# Patient Record
Sex: Female | Born: 1950
Health system: Southern US, Community
[De-identification: ages and names within clinical notes are randomized; demographics above are authoritative.]

## PROBLEM LIST (undated history)

## (undated) DIAGNOSIS — D759 Disease of blood and blood-forming organs, unspecified: Secondary | ICD-10-CM

## (undated) DIAGNOSIS — I10 Essential (primary) hypertension: Secondary | ICD-10-CM

## (undated) DIAGNOSIS — N189 Chronic kidney disease, unspecified: Secondary | ICD-10-CM

## (undated) DIAGNOSIS — Z8719 Personal history of other diseases of the digestive system: Secondary | ICD-10-CM

## (undated) DIAGNOSIS — E785 Hyperlipidemia, unspecified: Secondary | ICD-10-CM

## (undated) HISTORY — PX: KIDNEY TRANSPLANT: SHX239

## (undated) HISTORY — PX: RECTOCELE REPAIR: SHX761

## (undated) HISTORY — PX: OTHER SURGICAL HISTORY: SHX169

## (undated) HISTORY — PX: CYSTOCELE REPAIR: SHX163

## (undated) HISTORY — PX: BREAST EXCISIONAL BIOPSY: SUR124

## (undated) HISTORY — PX: ABDOMINAL HYSTERECTOMY: SHX81

## (undated) HISTORY — PX: SHOULDER SURGERY: SHX246

---

## 1998-11-13 ENCOUNTER — Ambulatory Visit (HOSPITAL_COMMUNITY): Admission: RE | Admit: 1998-11-13 | Discharge: 1998-11-13 | Payer: Self-pay | Admitting: Obstetrics & Gynecology

## 1998-11-13 ENCOUNTER — Encounter: Payer: Self-pay | Admitting: Obstetrics & Gynecology

## 1999-02-08 ENCOUNTER — Ambulatory Visit (HOSPITAL_COMMUNITY): Admission: RE | Admit: 1999-02-08 | Discharge: 1999-02-08 | Payer: Self-pay | Admitting: *Deleted

## 2000-09-12 ENCOUNTER — Ambulatory Visit (HOSPITAL_COMMUNITY): Admission: RE | Admit: 2000-09-12 | Discharge: 2000-09-12 | Payer: Self-pay | Admitting: Nephrology

## 2000-10-23 ENCOUNTER — Ambulatory Visit (HOSPITAL_COMMUNITY): Admission: RE | Admit: 2000-10-23 | Discharge: 2000-10-23 | Payer: Self-pay | Admitting: Obstetrics & Gynecology

## 2000-10-23 ENCOUNTER — Encounter: Payer: Self-pay | Admitting: Obstetrics & Gynecology

## 2000-11-23 ENCOUNTER — Encounter (INDEPENDENT_AMBULATORY_CARE_PROVIDER_SITE_OTHER): Payer: Self-pay | Admitting: *Deleted

## 2000-11-23 ENCOUNTER — Other Ambulatory Visit: Admission: RE | Admit: 2000-11-23 | Discharge: 2000-11-23 | Payer: Self-pay | Admitting: Obstetrics & Gynecology

## 2000-12-05 ENCOUNTER — Encounter: Payer: Self-pay | Admitting: Obstetrics & Gynecology

## 2000-12-05 ENCOUNTER — Ambulatory Visit (HOSPITAL_COMMUNITY): Admission: RE | Admit: 2000-12-05 | Discharge: 2000-12-05 | Payer: Self-pay | Admitting: Obstetrics & Gynecology

## 2000-12-20 ENCOUNTER — Encounter: Payer: Self-pay | Admitting: Obstetrics & Gynecology

## 2000-12-20 ENCOUNTER — Ambulatory Visit (HOSPITAL_COMMUNITY): Admission: RE | Admit: 2000-12-20 | Discharge: 2000-12-20 | Payer: Self-pay | Admitting: Obstetrics & Gynecology

## 2000-12-21 ENCOUNTER — Encounter (INDEPENDENT_AMBULATORY_CARE_PROVIDER_SITE_OTHER): Payer: Self-pay

## 2000-12-21 ENCOUNTER — Inpatient Hospital Stay (HOSPITAL_COMMUNITY): Admission: RE | Admit: 2000-12-21 | Discharge: 2000-12-24 | Payer: Self-pay | Admitting: Obstetrics & Gynecology

## 2001-11-14 ENCOUNTER — Encounter: Payer: Self-pay | Admitting: Obstetrics & Gynecology

## 2001-11-14 ENCOUNTER — Ambulatory Visit (HOSPITAL_COMMUNITY): Admission: RE | Admit: 2001-11-14 | Discharge: 2001-11-14 | Payer: Self-pay | Admitting: Obstetrics & Gynecology

## 2002-11-12 ENCOUNTER — Emergency Department (HOSPITAL_COMMUNITY): Admission: EM | Admit: 2002-11-12 | Discharge: 2002-11-12 | Payer: Self-pay | Admitting: Emergency Medicine

## 2002-11-12 ENCOUNTER — Encounter: Payer: Self-pay | Admitting: Emergency Medicine

## 2002-11-21 ENCOUNTER — Ambulatory Visit (HOSPITAL_COMMUNITY): Admission: RE | Admit: 2002-11-21 | Discharge: 2002-11-21 | Payer: Self-pay | Admitting: Gastroenterology

## 2002-11-21 ENCOUNTER — Encounter: Payer: Self-pay | Admitting: Gastroenterology

## 2002-12-24 ENCOUNTER — Ambulatory Visit (HOSPITAL_COMMUNITY): Admission: RE | Admit: 2002-12-24 | Discharge: 2002-12-24 | Payer: Self-pay | Admitting: Obstetrics & Gynecology

## 2002-12-24 ENCOUNTER — Encounter: Payer: Self-pay | Admitting: Obstetrics & Gynecology

## 2004-01-19 ENCOUNTER — Ambulatory Visit (HOSPITAL_COMMUNITY): Admission: RE | Admit: 2004-01-19 | Discharge: 2004-01-19 | Payer: Self-pay | Admitting: Obstetrics & Gynecology

## 2005-01-31 ENCOUNTER — Emergency Department (HOSPITAL_COMMUNITY): Admission: EM | Admit: 2005-01-31 | Discharge: 2005-01-31 | Payer: Self-pay | Admitting: Emergency Medicine

## 2005-05-17 ENCOUNTER — Ambulatory Visit (HOSPITAL_COMMUNITY): Admission: RE | Admit: 2005-05-17 | Discharge: 2005-05-17 | Payer: Self-pay | Admitting: Nephrology

## 2006-01-17 ENCOUNTER — Ambulatory Visit (HOSPITAL_COMMUNITY): Admission: RE | Admit: 2006-01-17 | Discharge: 2006-01-17 | Payer: Self-pay | Admitting: Obstetrics & Gynecology

## 2006-02-09 ENCOUNTER — Encounter: Admission: RE | Admit: 2006-02-09 | Discharge: 2006-02-09 | Payer: Self-pay | Admitting: Obstetrics & Gynecology

## 2006-12-12 ENCOUNTER — Inpatient Hospital Stay (HOSPITAL_COMMUNITY): Admission: RE | Admit: 2006-12-12 | Discharge: 2006-12-13 | Payer: Self-pay | Admitting: Obstetrics and Gynecology

## 2007-06-13 ENCOUNTER — Ambulatory Visit: Payer: Self-pay | Admitting: Vascular Surgery

## 2007-06-25 ENCOUNTER — Ambulatory Visit: Payer: Self-pay | Admitting: Vascular Surgery

## 2007-06-25 ENCOUNTER — Ambulatory Visit (HOSPITAL_COMMUNITY): Admission: RE | Admit: 2007-06-25 | Discharge: 2007-06-25 | Payer: Self-pay | Admitting: Vascular Surgery

## 2007-07-24 ENCOUNTER — Ambulatory Visit: Payer: Self-pay | Admitting: Vascular Surgery

## 2007-12-28 ENCOUNTER — Inpatient Hospital Stay (HOSPITAL_COMMUNITY): Admission: EM | Admit: 2007-12-28 | Discharge: 2007-12-29 | Payer: Self-pay | Admitting: Internal Medicine

## 2008-04-28 ENCOUNTER — Encounter: Admission: RE | Admit: 2008-04-28 | Discharge: 2008-04-28 | Payer: Self-pay | Admitting: Obstetrics & Gynecology

## 2008-05-08 ENCOUNTER — Emergency Department: Payer: Self-pay | Admitting: Emergency Medicine

## 2008-09-11 ENCOUNTER — Emergency Department (HOSPITAL_COMMUNITY): Admission: EM | Admit: 2008-09-11 | Discharge: 2008-09-11 | Payer: Self-pay | Admitting: Emergency Medicine

## 2008-09-14 ENCOUNTER — Emergency Department (HOSPITAL_COMMUNITY): Admission: EM | Admit: 2008-09-14 | Discharge: 2008-09-14 | Payer: Self-pay | Admitting: Emergency Medicine

## 2008-09-21 ENCOUNTER — Ambulatory Visit (HOSPITAL_COMMUNITY): Admission: RE | Admit: 2008-09-21 | Discharge: 2008-09-21 | Payer: Self-pay | Admitting: Internal Medicine

## 2010-09-26 ENCOUNTER — Encounter: Payer: Self-pay | Admitting: Internal Medicine

## 2010-12-20 LAB — BASIC METABOLIC PANEL
CO2: 25 mEq/L (ref 19–32)
Calcium: 8.9 mg/dL (ref 8.4–10.5)
Glucose, Bld: 89 mg/dL (ref 70–99)
Potassium: 3.7 mEq/L (ref 3.5–5.1)
Sodium: 139 mEq/L (ref 135–145)

## 2010-12-20 LAB — DIFFERENTIAL
Basophils Absolute: 0.1 10*3/uL (ref 0.0–0.1)
Basophils Relative: 1 % (ref 0–1)
Eosinophils Relative: 2 % (ref 0–5)
Lymphs Abs: 2.2 10*3/uL (ref 0.7–4.0)
Neutro Abs: 2.9 10*3/uL (ref 1.7–7.7)
Neutrophils Relative %: 50 % (ref 43–77)

## 2010-12-20 LAB — CBC
HCT: 36.1 % (ref 36.0–46.0)
Platelets: 193 10*3/uL (ref 150–400)
RBC: 4.19 MIL/uL (ref 3.87–5.11)
WBC: 5.7 10*3/uL (ref 4.0–10.5)

## 2010-12-20 LAB — SEDIMENTATION RATE: Sed Rate: 12 mm/hr (ref 0–22)

## 2011-01-18 NOTE — Op Note (Signed)
NAME:  Megan Small, Megan Small                 ACCOUNT NO.:  [id number]   MEDICAL RECORD NO.:  [id number]          PATIENT TYPE:  AMB   LOCATION:  SDS                          FACILITY:  [facility]   PHYSICIAN:  Di Kindle. Edilia Bo, M.D.DATE OF BIRTH:  07/21/1951   DATE OF PROCEDURE:  06/25/2007  DATE OF DISCHARGE:                               OPERATIVE REPORT   PREOPERATIVE DIAGNOSIS:  Chronic renal failure.   POSTOPERATIVE DIAGNOSIS:  Chronic renal failure.   PROCEDURE:  Placement of a new left upper arm AV fistula.   SURGEON:  Dr. Edilia Bo.   ASSISTANT:  Emilio Aspen, RNFA.   ANESTHESIA:  Local with sedation.   INDICATIONS:  This is a 60 year old woman with polycystic kidney  disease.  She was referred by Dr. Eliott Nine for an AV fistula.  She had  undergone a vein map which showed that the cephalic vein in the left  ranged from 0.3-0.4 cm in maximum diameter.  This was based on a vein  map on June 13, 2007.  She was sent up for Dr. Darrick Penna to place a  fistula on, the thought being if she could not have a fistula she would  not have a graft placed at this time.   TECHNIQUE:  The patient was taken to the operating room and sedated by  anesthesia.  The left upper extremity was prepped and draped in the  usual sterile fashion.  After the skin was anesthetized with 1%  lidocaine, a transverse incision was made just above the antecubital  level.  Here the cephalic vein was quite small. The artery was dissected  free beneath the fascia and this was quite small also.  Upon  interrogation with the Doppler, it became apparent that the patient had  a high bifurcation of the brachial artery and the ulnar artery was  selected for outflow as the patient had requested a fistula or no graft  at this time.  The cephalic vein was ligated distally and irrigated up  with heparinized saline. It was about a 3-1/2-mm vein.  The patient was  heparinized.  The ulnar artery was clamped proximally and distally  and a  longitudinal arteriotomy was made.  The vein was mobilized over and sewn  end-to-side to the artery using continuous 6-0 Prolene suture. At the  completion there was a thrill in the fistula.  Hemostasis was obtained  in the wound, the wound was closed with a deep layer of 3-0 Vicryl, the  skin closed with 4-0 Vicryl. A sterile dressing was applied.  The  patient tolerated the procedure well and was transferred to the recovery  room in satisfactory condition.  All needle and sponge counts were  correct.      Di Kindle. Edilia Bo, M.D.  Electronically Signed     CSD/MEDQ  D:  06/25/2007  T:  06/26/2007  Job:  [id number]

## 2011-01-18 NOTE — Procedures (Signed)
CEPHALIC VEIN MAPPING   INDICATION:  Left upper arm AVF occlusion.   HISTORY: ESRD.   EXAM:  Right cephalic vein mapping.   The right cephalic vein is compressible.  The diameter measurements  range from 0.19 cm to 0.39 cm.  The left cephalic vein not evaluation.   See attached work sheet for all measurements.   IMPRESSION:  Patent right cephalic vein, which is of acceptable diameter  for use as a dialysis access site in the upper arm.  Inadequate in  caliber in right forearm.   ___________________________________________  Di Kindle. Edilia Bo, M.D.   PB/MEDQ  D:  07/24/2007  T:  07/25/2007  Job:  [id number]

## 2011-01-18 NOTE — Assessment & Plan Note (Signed)
OFFICE VISIT   Megan Small, Megan Small  DOB:  [birth year]                                       07/24/2007  ZOXWR#:[id number]   I saw [name] in the office today for continued followup of her  dialysis access issues.  This is a 60 year old woman with polycystic  kidney disease.  She was set up to have a fistula placed in the left arm  and if this was not possible we were not to place an AV graft.  She  underwent surgery on 06/25/2007.  At the time of surgery it was noted  that her vein was quite small and she also had a high bifurcation of her  brachial artery.  However, since we were not going to place a graft, we  elected to try a fistula and unfortunately this did not work and is now  occluded.  She was sent over to be evaluated for a possible right arm  fistula.  Again, it is felt that we would not place a graft if a fistula  were not possible.   On examination she has a palpable brachial and radial pulse bilaterally.  Her fistula on the left upper arm is occluded.  There was a small stitch  in the lateral aspect of the wound which I removed.   Her vein map today shows that the upper arm cephalic vein on the right  is essentially the same size as it was on the left side.  The forearm  cephalic vein is very small.   I explained that I think we probably have a 50/50 chance that the  fistula in the right arm will be more successful than the one on the  left.  Again, I think the vein is quite small and did not distend out  very well.  I have offered to place this; however, she would prefer to  wait for now  until her kidney function suggests that dialysis is more imminent.  I  think this is reasonable given the small size of her vein.  I will be  happy to see her back at any time if she changes her mind.   Di Kindle. Edilia Bo, M.D.  Electronically Signed   CSD/MEDQ  D:  07/24/2007  T:  07/25/2007  Job:  [id number]   cc:   [name], M.D.

## 2011-01-18 NOTE — H&P (Signed)
[name], [name]                 ACCOUNT NO.:  [id number]   MEDICAL RECORD NO.:  [id number]          PATIENT TYPE:   LOCATION:                                 FACILITY:   PHYSICIAN:  Deirdre Peer. Polite, M.D. DATE OF BIRTH:  September 19, 1950   DATE OF ADMISSION:  DATE OF DISCHARGE:                              HISTORY & PHYSICAL   REASON FOR ADMISSION:  Dyspnea, rule out angina.   HISTORY OF PRESENT ILLNESS:  [name] presents to the office, second  visit, with a complaint of a 2-3 week history of chest pain, shortness  of breath, and fatigue.  She denies exertional shortness of breath, mid  sternal, occasionally with radiation to both arms and to her neck.  The  worst it has been is a 7/10.  She gets relief with rest.  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  She  is very short of breath and tired.  She does have a history of  hypertension, high cholesterol, renal insufficiency.  She states that  her last creatinine is 1.8.  She denies any orthopnea.  No PND.  She  does notice that she has had some swelling in her legs and weight gain.  From looking in the chart, it appears that the patient is approximately  16 pounds from previous visit less than one month ago.  She does have a  family history of coronary artery disease.  Her father had an MI at 84.   PAST MEDICAL HISTORY:  As stated above.  Significant for hypertension,  high cholesterol, renal insufficiency, per patient, secondary to  polycystic kidney disease, currently followed by Dr. Eliott Nine.   CURRENT MEDICATIONS:  1. Lisinopril 10 mg daily.  2. Altoprev 40 mg daily.  3. Premarin 0.625 mg daily.   SOCIAL HISTORY:  Negative for tobacco, alcohol, or drugs.   PAST SURGICAL HISTORY:  Significant for a tubal ligation in 1975.  Hysterectomy in 2001.  Patient states that she had attempt at an AV  fistula in the past in her left upper extremity.   ALLERGIES:  None noted.   FAMILY HISTORY:   Father with coronary artery disease, status post MI at  age 21.  Brother and mother with pancreatic cancer.   REVIEW OF SYSTEMS:  As stated in the HPI.   PHYSICAL EXAMINATION:  BP 140/88, temperature 97.9, pulse 70.  Weight  210.  HEENT:  Negative for JVD.  Negative for carotid bruits.  Otherwise  within normal limits.  CHEST:  Clear without rales or rhonchi.  CARDIOVASCULAR:  Regular S1 and S2.  No S3 appreciated.  ABDOMEN:  Obese.  No hepatosplenomegaly.  EXTREMITIES:  Edema 1+.  NEURO:  Nonfocal.   EKG:  Poor R wave progression.  Inverted T wave, V1-4.   ASSESSMENT:  1. Dyspnea, rule out angina.  2. Hypertension.  3. Dyslipidemia.  4. History of progressive renal dysfunction.  5. History of mild anemia.   RECOMMENDATIONS:  Patient will be admitted to a medicine floor bed.  Patient will have serial cardiac  enzymes.  Will have screening labs.  [facility] has been notified.  Will have further recommendations  after review of the above studies.      Deirdre Peer. Polite, M.D.  Electronically Signed     RDP/MEDQ  D:  12/28/2007  T:  12/28/2007  Job:  [id number]

## 2011-01-18 NOTE — Procedures (Signed)
CEPHALIC VEIN MAPPING   INDICATION:  End-stage renal disease.   HISTORY:  End-stage renal disease.   EXAM:   The right cephalic vein is not evaluated.   The left cephalic vein is compressible.   Diameter measurements range from 0.23 to 0.30 cm.   See attached worksheet for all measurements.   IMPRESSION:  Patent left cephalic vein which is of acceptable diameter  for use as a dialysis access site.   ___________________________________________  [name], MD   MG/MEDQ  D:  06/13/2007  T:  06/14/2007  Job:  [id number]

## 2011-01-18 NOTE — Assessment & Plan Note (Signed)
OFFICE VISIT   Small, Megan L  DOB:  11-12-1950                                       06/13/2007  ZOXWR#:[id number]   HISTORY:  The patient is a 59 year old female with polycystic kidney  disease.  She is referred by Dr. Sharen Heck for evaluation for  hemodialysis access.  She also has a past medical history significant  for hypertension.  She is right-handed.  She states that recently her  creatinine was over 2 but this has recovered somewhat with adjustments  in her medications.   PAST MEDICAL HISTORY:  She denies history of diabetes or coronary artery  disease.  She does have history of elevated cholesterol.  Past history  is also remarkable for polycystic liver and a pancreatic cyst.   PAST SURGICAL HISTORY:  Remarkable for pelvic prolapse repair.  She has  had no prior access procedures.   FAMILY HISTORY:  Remarkable for polycystic kidney disease in multiple  relatives.   SOCIAL HISTORY:  She is single, has 2 children.  Works as a Adult nurse at [employer]  She is a nonsmoker, nondrinker.   REVIEW OF SYSTEMS:  She is 5 feet 5 inches, 200 pounds.  She has some  dyspnea with exertion and kidney disease as listed above.  Her  pulmonary, GI, vascular, neuro, orthopedic and hematologic are all  negative.   MEDICATIONS:  1. Premarin 0.625 mg once daily.  2. Lisinopril 4 times daily.   DICTATION ENDED AT THIS POINT.   [name], MD  Electronically Signed   CEF/MEDQ  D:  06/13/2007  T:  06/14/2007  Job:  [id number]   cc:   [name], M.D.

## 2011-01-21 NOTE — Op Note (Signed)
NAMEELISHA, MCGRUDER                 ACCOUNT NO.:  [id number]   MEDICAL RECORD NO.:  [id number]          PATIENT TYPE:  AMB   LOCATION:  SDC                           FACILITY:  [facility]   PHYSICIAN:  Randye Lobo, M.D.   DATE OF BIRTH:  04-12-1951   DATE OF PROCEDURE:  12/12/2006  DATE OF DISCHARGE:                               OPERATIVE REPORT   PREOPERATIVE DIAGNOSES:  1. Genuine stress incontinence.  2. Incomplete vaginal prolapse.   POSTOPERATIVE DIAGNOSES:  1. Genuine stress incontinence.  2. Incomplete vaginal prolapse.   PROCEDURE:  A tension-free vaginal tape suburethral sling, cystoscopy,  anterior posterior colporrhaphy.   SURGEON:  Randye Lobo, M.D.   ASSISTANT:  Gerrit Friends. Aldona Bar, M.D.   ANESTHESIA:  Spinal epidural, local with 0.5% lidocaine with epinephrine  1:200,000.   IV FLUIDS:  1000 mL Ringer's lactate.   ESTIMATED BLOOD LOSS:  75 mL.   URINE OUTPUT:  400 mL.   COMPLICATIONS:  None.   INDICATIONS FOR PROCEDURE:  The patient was a 60 year old para 2  Caucasian female, status post total abdominal hysterectomy with  bilateral salpingo-oophorectomy in 2002 who presented to her primary  gynecologist, Dr. Annamaria Helling reporting vaginal protrusion.  The patient  was diagnosed with a second-degree rectocele and a small cystocele.  The  patient also upon questioning reported urinary incontinence with  coughing and sneezing.  The patient presented for further evaluation and  urodynamic testing on 11/06/2006 documented genuine stress incontinence  when the patient reached full bladder capacity.  The patient does have a  history of polycystic kidneys and she has a baseline creatinine of  approximately 1.5.  The patient now desires treatment of her cystocele  and rectocele along with her stress urinary incontinence and a plan was  made to proceed with a tension-free vaginal tape, suburethral sling,  cystoscopy, and the anterior and posterior colporrhaphy after  risks,  benefits, and alternatives are reviewed.   FINDINGS:  Examination under anesthesia revealed a first-degree  cystocele and a second-degree high rectocele.  There was good vaginal  apical support.  The cervix and uterus were noted to be absent.   Cystoscopy documented a normal bladder throughout 360 degrees including  the bladder dome and trigone.  The ureters were noted to be patent  bilaterally after the injection of indigo carmine dye.  During placement  of the sling, there was no evidence of a foreign body in the bladder or  the urethra.   SPECIMENS:  None.   PROCEDURE:  The patient was reidentified in the preoperative hold area.  She did receive Ancef 1 gram IV for antibiotic prophylaxis.  The patient  received TED hose and PAS stockings for DVT prophylaxis.   The patient was brought to the operating room where a spinal epidural  anesthetic was administered.  The patient was then placed in the dorsal  lithotomy position and the lower abdomen and vagina were sterilely  prepped and draped.  A Foley catheter was placed inside the bladder and  left to gravity drainage throughout the procedure.   An examination  under anesthesia was performed and the findings are as  noted above.   A weighted speculum was placed inside the vagina and Allis clamps were  used to mark the length of the anterior vaginal wall in the midline.  The vaginal mucosa was then injected with half percent lidocaine with  1:200,000 of epinephrine.  The anterior vaginal mucosa was then incised  vertically with a scalpel in the midline.  The vaginal mucosa was  dissected off of the subvaginal tissue and endopelvic fascia  bilaterally.  The dissection was carried back to the level of the pubic  rami and inferiorly to the level of the vaginal apex.  Hemostasis during  the dissection was created with monopolar cautery.   The suburethral sling was performed next.  It was performed in a top-  down fashion.  1  cm suprapubic incisions were then created 3 cm to the  right and to the left of the midline of the pubic symphysis.  The  abdominal needle passer was then placed through the right suprapubic  incision and down and out through the endopelvic fascia on the  ipsilateral side.  The same procedure that was performed on the right-  hand side was then repeated on the left-hand side.  The Foley catheter  was removed at this time and indigo carmine dye was injected IV.  Cystoscopy was performed and the findings are as noted above.  The  bladder was then drained of cystoscopy fluid and a Foley catheter was  replaced.  The sling was attached to the abdominal needle passers and  was drawn up through the suprapubic incisions bilaterally.  Tension on  the sling was adjusted and the sling was separated from the plastic  sheaths.  With a Kelly clamp placed between the sling and the urethra,  the plastic sheaths were then removed.  Excess sling material was  trimmed suprapubically.   The anterior colporrhaphy was performed with vertical mattress sutures  of 2-0 Vicryl which reduced the cystocele.  Excess vaginal mucosa was  trimmed anteriorly on the vaginal wall and the incision was then closed  with a running locked suture of 2-0 Vicryl.   The posterior colporrhaphy was performed last.  The Allis clamps were  used to mark the perineal body at the posterior vaginal wall in the  midline.  The perineum and the posterior vaginal mucosa were then  injected with half percent lidocaine with 1:200,000 of epinephrine.  A  triangular wedge of epithelium was excised from the perineal body in the  posterior vaginal mucosa was then incised vertically in the midline with  the Metzenbaum scissors.  The perirectal fascia was dissected off of the  overlying vaginal mucosa bilaterally.  The dissection was carried to the level of the vaginal apex.  There was no documented enterocele which was  appreciated.  At the top of  the posterior colporrhaphy, a pursestring  suture of 2-0 Vicryl was placed for the first suture for reduction of  the rectocele.  Then a series of two vertical mattress sutures of 2-0  Vicryl were placed.  The vertical mattress sutures continued to the  perineal body at this time next using 0-0 Vicryl.  Excess vaginal mucosa  was then trimmed and the posterior vaginal wall was closed with a  running locked suture of 2-0 Vicryl down to the level of the hymen.  A  crown stitch of 0-0 Vicryl was next placed at the level of the perineum.  The remainder of  the perineal body was then closed with a running suture  of 2-0 Vicryl which was continued along the perineum in a subcuticular  fashion for closure as for an episiotomy repair.   The suprapubic incisions were closed at this time with Dermabond.  A  vaginal packing with Estrace cream was placed in the vagina.  Final  rectal exam confirmed the absence of sutures in the rectum.   This concluded the patient's procedure.  There were no complications.  All needle, instrument, sponge counts were correct.      Randye Lobo, M.D.  Electronically Signed     BES/MEDQ  D:  12/12/2006  T:  12/12/2006  Job:  [id number]

## 2011-01-21 NOTE — Op Note (Signed)
[facility]  Patient:    Megan Small, Megan Small                        MRN: [id number] Proc. Date: 12/21/00 Adm. Date:  [id number] Attending:  Mickle Mallory                           Operative Report  AGE:                          60  PREOPERATIVE DIAGNOSIS:       Leiomyomatous uterus, abnormal uterine bleeding,                               menorrhagia, likely endometrial polyps.  POSTOPERATIVE DIAGNOSIS:      Leiomyomatous uterus, abnormal uterine bleeding,                               menorrhagia, likely endometrial polyps,                               pathology pending.  OPERATION:                    Total abdominal hysterectomy and bilateral                               salpingo-oophorectomy.  SURGEON:                      Gerrit Friends. Aldona Bar, M.D.  ASSISTANTDebbe Bales A. Edward Jolly, M.D.  ANESTHESIA:                   General endotracheal.  DESCRIPTION OF PROCEDURE:     The patient was taken to the operating room where after the satisfactory induction of general endotracheal anesthesia, she was prepped and draped, and placed in the supine position with the Foley catheter inserted as part of the prep.  Once the patient was adequately draped, the procedure was begun.  A Pfannenstiel incision was made with minimal difficulty.  It was dissected down sharply to and through the fascia in a low transverse fashion with hemostasis created at each layer.  Subfascial space was created inferiorly and superiorly, and the muscles separated in the midline, the peritoneum identified, and entered appropriately with care taken to avoid the bowel superiorly and the bladder inferiorly.  Exploration of the abdomen at this time was carried out.  Upper abdomen was benign.  Both kidneys were felt.  The uterus was approximately 12 weeks size and very irregular.  Both ovaries appeared normal.  There had been a previous tubal sterilization  procedure carried out.  At this time, the self-retaining OConnor-OSullivan retractor was placed and with minimal difficulty bowel packed off and hysterectomy begun.  The cornus of the uterus were elevated with long Kelly clamps.  Round ligaments were then suture secured, cut, and the lower uterine segment exposed once the bladder was pushed off.  The infundibulopelvic pedicles were identified.  The ureters were identified, and then the infundibulopelvic  pedicles were clamped, cut, and doubly suture secured with 0 Vicryl suture.  At this time, the uterine artery pedicles were skeletonized, clamped, cut, and suture secured with 0 Vicryl suture.  Because of the bulk of the uterus, the bulk of the fundus was removed.  This provided better exposure for the lower segment and cervix dissection.  The cervical stump was elevated and using straight Heaney clamps, the parametrial tissues were cut, clamped, and suture secured with 0 Vicryl suture down to the vaginal angle.  Uterosacral pedicles were taken in a similar fashion.  A stab wound was made anteriorly and using the Satinsky scissors, the cervix was dissected out in its entirety.  Vaginal cuff was then secured beginning in the angles with a figure-of-eight 0 Vicryl suture and then figure-of-eight interrupted 0 Vicryl sutures to close the vaginal cuff. Profuse irrigation was then carried out.  Identification of the ureters was again carried out and both ureters were noted to be of normal course and caliber.  There was a small amount of bleeding noted from the right ureterosacral pedicle, and this was suture secured with 0 Vicryl and 2-0 Vicryl suture.  Again, profuse irrigation was carried out and reidentification of the ureters carried out.  Hemostasis was found to be adequate and at this time the procedure was terminated.  At this time, the packs were removed.  Appendix was not visualized.  It was extremely retrocecal.  After all counts  were noted to be correct and no foreign bodies noted to be remaining in the abdominal cavity, the abdominal peritoneum was closed with 2-0 Vicryl in a running fashion and muscles secured with same. ________ fascial hemostasis, the fascia was then reapproximated with 0 Vicryl from angles to midline bilaterally.  The subcutaneous tissue was then rendered hemostatic and staples were then used to close the skin.  A sterile pressure dressing was then applied and the patient at this time was transported to the recovery room in satisfactory condition having tolerated the procedure well.  Estimated blood loss 200 cc.  All counts correct x 2.  In summary, this patient underwent a total abdominal hysterectomy and bilateral salpingo-oophorectomy for a leiomyomatous uterus associated with menorrhagia and likely endometrial polyps.  She tolerated the procedure well and was taken to the recovery room in good condition. DD:  12/21/00 TD:  12/22/00 Job: [id number]

## 2011-01-21 NOTE — Discharge Summary (Signed)
[facility]  Patient:    Megan Small, BACCHI                        MRN: [id number] Adm. Date:  [id number] Disc. Date: [id number] Attending:  Mickle Mallory CC:         Duke Salvia Eliott Nine, M.D.   Discharge Summary  DISCHARGE DIAGNOSES:          1. Leiomyomatous uterus.                               2. Adenomyosis.  PROCEDURE:                    Total abdominal hysterectomy, bilateral salpingo-oophorectomy.  HISTORY:                      This 60 year old female was admitted for a total abdominal hysterectomy and bilateral salpingo-oophorectomy with a preoperative diagnosis of menorrhagia, known fibroids, possible endometrial polyps, and dysmenorrhea.  After a normal preoperative workup she underwent a TAH, BSO on April 18.  The procedure went well.  Postoperatively she did have some apnea while sleeping.  Her O2 saturations remained while awake normal, but dozing dropped off to initially the range of the 70s, although as she got further out from her surgery ended up in the 90-92 range.  She was watched in the intensive care area on the night after surgery.  On the morning following surgery she still had a respiratory rate of about 6 while resting.  She also had a run of bigemini.  Cardiology was called and their opinion was if the patient was asymptomatic, probably that there was nothing to demand further workup.  Indeed, the patient was totally asymptomatic.  On April 19 her hemoglobin was 9.7, white count 6800.  Comprehensive metabolic profile normal. Her output was good.  She was transferred to the floor.  Thereafter, she did well during her entire postoperative course.  Another hemoglobin was done on the morning of April 20 which was 9.6 with a white count of 6500.  On the morning of April 21 she was ambulating well, tolerating a regular diet well, having normal bowel and bladder function, was afebrile.  The wound was clean and dry.  Her wound had  staples removed and was Steri-Stripped on April 21. Accordingly, she was discharged to home.  DISCHARGE MEDICATIONS:        1. Tylox one to two q.4-6h. p.r.n.                               2. Motrin 600 mg q.6h.                               3. Ferrous sulfate 300 mg q.d.                               4. Vivelle dot 0.05 mg changed twice weekly.  DISCHARGE INSTRUCTIONS:       She was given all specific instructions at the time of discharge and understood all instructions well.  She will return to the office in approximately 3-4 weeks for followup or as needed.  CONDITION ON DISCHARGE:  Improved. DD:  12/24/00 TD:  12/25/00 Job: [id number]

## 2011-01-21 NOTE — H&P (Signed)
[facility]  Patient:    Megan Small, Megan Small                          MRN: [id number] Adm. Date:  12/21/00 Attending:  Gerrit Friends. Aldona Bar, M.D.                         History and Physical  OFFICE NUMBER:                [id number]  ADMISSION INFORMATION:        This patient is being admitted to [facility] on the morning of December 21, 2000, after surgery.  Please try to have this history and physical to [facility] to the Day Surgery preop area. The patient is coming in around 9 a.m.  HISTORY:                      This patient is a gravida 2, para 59, 60 year old female admitted for total abdominal hysterectomy and bilateral salpingo-oophorectomy with a preoperative diagnosis of excessive menorrhagia, a known fibroid uterus, and endometrial polyps.  This patient was seen by me most recently in mid to late March, and at that time had a large 1 cm polyp removed from her cervix and had a good endometrial biopsy done as well which revealed disorder proliferative endometrium with breakdown, and the polyp was benign.  The patient presented with flooding and a history of very irregular cycles and when her cycles occur, they last for as long as 7-10 days with significant symptomatology.  It is almost to the point where it keeps her from going to work, as she works on the night shift as a Transport planner at [employer].  After the biopsy/evaluation, the patient returned for follow-up.  An FSH level was very premenopausal.  Discussion was carried out with the patient as to what could be offered, and the patient desired proceeding with surgical correction of her menorrhagia, known fibroids, and likely endometrial polyps.  In fact, as part of her evaluation, an ultrasound was done at [facility] which revealed a uterus which was lobular and enlarged, measuring 11 x 8.5 x 6 cm with at least five large intramural myomas.  As mentioned, an [facility]  level was done and was essentially normal.  The patient does have known polycystic kidney disease and is followed by Dr. Eliott Nine - recent renal function studies have all been within normal limits - (January 2002).  She did have a mammogram in February 2002, which was normal, and all of her recent cervical cytology has been within normal limits as well.  In summary, because of persistent symptomatic menorrhagia, known fibroids, and likely endometrial polyps, this patient is admitted for total abdominal hysterectomy and bilateral salpingo-oophorectomy.  PAST MEDICAL HISTORY:         Illnesses - as above.  MEDICATIONS:                  Essentially none.  PAST SURGICAL HISTORY:        1. Breast cyst.                               2. Tubal sterilization procedure 26 years ago.  3. Wisdom teeth when the patient was in her 67s.                               4. Tonsillectomy and adenoidectomy at age 48.  ALLERGIES:                    None known.  SOCIAL HISTORY AND FAMILY HISTORY:               Noncontributory.  REVIEW OF SYSTEMS:            Essentially negative with the exception of above.  PHYSICAL EXAMINATION:  GENERAL:                      Well-developed female.  VITAL SIGNS:                  Weight 200 pounds, blood pressure 150/80, pulse 80 and regular, respirations 18 and regular, temperature 98.2.  HEENT:                        Negative.  NECK:                         Thyroid not enlarged.  CHEST:                        Clear.  CARDIOVASCULAR:               No gallop or murmur.  BREASTS:                      Negative.  ABDOMEN:                      Unremarkable.  PELVIC:                       External genitalia and BUS normal.  Introitus by digital, vault fairly well supported.  Cervix with no gross lesions.  Uterus is very irregular and approximately 10-12 weeks size.  Adnexal unremarkable. Rectovaginal confirmatory.  RECTAL:                        Stool negative for blood.  EXTREMITIES/NEUROLOGIC:       Physiologic.  IMPRESSION:                   Symptomatic menorrhagia associated with a leiomyomata uterus and endometrial polyps - patient desires surgical correction.  PLAN:                         The patient will undergo a total abdominal hysterectomy and bilateral salpingo-oophorectomy. DD:  12/20/00 TD:  12/20/00 Job: [id number]

## 2011-03-25 ENCOUNTER — Ambulatory Visit: Payer: Self-pay | Admitting: Vascular Surgery

## 2011-04-01 ENCOUNTER — Ambulatory Visit: Payer: Self-pay | Admitting: Vascular Surgery

## 2011-04-11 ENCOUNTER — Other Ambulatory Visit: Payer: Self-pay | Admitting: Obstetrics & Gynecology

## 2011-04-11 DIAGNOSIS — Z1231 Encounter for screening mammogram for malignant neoplasm of breast: Secondary | ICD-10-CM

## 2011-05-10 ENCOUNTER — Ambulatory Visit
Admission: RE | Admit: 2011-05-10 | Discharge: 2011-05-10 | Disposition: A | Discharge status: Home / Self Care | Payer: PRIVATE HEALTH INSURANCE | Source: Ambulatory Visit | Attending: Obstetrics & Gynecology | Admitting: Obstetrics & Gynecology

## 2011-05-10 DIAGNOSIS — Z1231 Encounter for screening mammogram for malignant neoplasm of breast: Secondary | ICD-10-CM

## 2011-05-31 LAB — CARDIAC PANEL(CRET KIN+CKTOT+MB+TROPI)
CK, MB: 1.4
CK, MB: 1.6
CK, MB: 2
Relative Index: 1.8
Total CK: 110
Total CK: 83
Total CK: 90
Troponin I: <0.01

## 2011-05-31 LAB — CBC
HCT: 33.9 — ABNORMAL LOW
Hemoglobin: 11.8 — ABNORMAL LOW
MCV: 85.3
WBC: 5.5

## 2011-05-31 LAB — COMPREHENSIVE METABOLIC PANEL
ALT: 13
Glucose, Bld: 106 — ABNORMAL HIGH
Potassium: 4.2
Total Protein: 6.7

## 2011-05-31 LAB — TSH: TSH: 2.042

## 2011-05-31 LAB — B-NATRIURETIC PEPTIDE (CONVERTED LAB): Pro B Natriuretic peptide (BNP): <30

## 2011-05-31 LAB — LIPID PANEL
HDL: 66
Triglycerides: 192 — ABNORMAL HIGH
VLDL: 38

## 2011-05-31 LAB — HEMOGLOBIN A1C: Mean Plasma Glucose: 119

## 2011-06-15 LAB — POCT I-STAT 4, (NA,K, GLUC, HGB,HCT)
Operator id: 181601
Potassium: 4.1
Sodium: 135

## 2011-11-21 ENCOUNTER — Ambulatory Visit: Payer: Self-pay

## 2011-12-09 ENCOUNTER — Ambulatory Visit: Payer: Self-pay | Admitting: General Practice

## 2011-12-09 LAB — CBC WITH DIFFERENTIAL/PLATELET
HGB: 11.2 g/dL — ABNORMAL LOW (ref 12.0–16.0)
Lymphocyte #: 2.7 10*3/uL (ref 1.0–3.6)
Lymphocyte %: 44.5 %
MCH: 29.3 pg (ref 26.0–34.0)
Monocyte %: 6.6 %
Neutrophil #: 2.8 10*3/uL (ref 1.4–6.5)
Platelet: 211 10*3/uL (ref 150–440)

## 2011-12-09 LAB — APTT: Activated PTT: 27.4 secs (ref 23.6–35.9)

## 2011-12-16 ENCOUNTER — Ambulatory Visit: Payer: Self-pay | Admitting: General Practice

## 2012-03-01 ENCOUNTER — Ambulatory Visit: Payer: Self-pay | Admitting: Vascular Surgery

## 2012-06-12 DIAGNOSIS — T8611 Kidney transplant rejection: Secondary | ICD-10-CM | POA: Insufficient documentation

## 2012-11-16 ENCOUNTER — Other Ambulatory Visit: Payer: Self-pay

## 2012-11-16 DIAGNOSIS — Z1231 Encounter for screening mammogram for malignant neoplasm of breast: Secondary | ICD-10-CM

## 2012-12-10 ENCOUNTER — Ambulatory Visit
Admission: RE | Admit: 2012-12-10 | Discharge: 2012-12-10 | Disposition: A | Discharge status: Home / Self Care | Payer: 59 | Source: Ambulatory Visit

## 2012-12-10 DIAGNOSIS — Z1231 Encounter for screening mammogram for malignant neoplasm of breast: Secondary | ICD-10-CM

## 2013-07-05 DIAGNOSIS — R519 Headache, unspecified: Secondary | ICD-10-CM | POA: Insufficient documentation

## 2013-09-19 DIAGNOSIS — M76899 Other specified enthesopathies of unspecified lower limb, excluding foot: Secondary | ICD-10-CM | POA: Diagnosis not present

## 2013-10-08 DIAGNOSIS — Z940 Kidney transplant status: Secondary | ICD-10-CM | POA: Diagnosis not present

## 2013-10-08 DIAGNOSIS — I10 Essential (primary) hypertension: Secondary | ICD-10-CM | POA: Diagnosis not present

## 2013-10-08 DIAGNOSIS — E785 Hyperlipidemia, unspecified: Secondary | ICD-10-CM | POA: Diagnosis not present

## 2013-10-08 DIAGNOSIS — B349 Viral infection, unspecified: Secondary | ICD-10-CM | POA: Diagnosis not present

## 2013-10-08 DIAGNOSIS — D899 Disorder involving the immune mechanism, unspecified: Secondary | ICD-10-CM | POA: Diagnosis not present

## 2013-10-10 ENCOUNTER — Ambulatory Visit: Payer: Self-pay | Admitting: General Practice

## 2013-10-17 DIAGNOSIS — Q612 Polycystic kidney, adult type: Secondary | ICD-10-CM | POA: Diagnosis not present

## 2013-10-17 DIAGNOSIS — B259 Cytomegaloviral disease, unspecified: Secondary | ICD-10-CM | POA: Diagnosis not present

## 2013-10-17 DIAGNOSIS — Z79899 Other long term (current) drug therapy: Secondary | ICD-10-CM | POA: Diagnosis not present

## 2013-10-17 DIAGNOSIS — D649 Anemia, unspecified: Secondary | ICD-10-CM | POA: Diagnosis not present

## 2013-10-17 DIAGNOSIS — E785 Hyperlipidemia, unspecified: Secondary | ICD-10-CM | POA: Diagnosis not present

## 2013-10-17 DIAGNOSIS — I10 Essential (primary) hypertension: Secondary | ICD-10-CM | POA: Diagnosis not present

## 2013-10-17 DIAGNOSIS — D72819 Decreased white blood cell count, unspecified: Secondary | ICD-10-CM | POA: Diagnosis not present

## 2013-10-17 DIAGNOSIS — Z940 Kidney transplant status: Secondary | ICD-10-CM | POA: Diagnosis not present

## 2013-10-17 DIAGNOSIS — Z5181 Encounter for therapeutic drug level monitoring: Secondary | ICD-10-CM | POA: Diagnosis not present

## 2013-10-17 DIAGNOSIS — N186 End stage renal disease: Secondary | ICD-10-CM | POA: Diagnosis not present

## 2013-10-17 DIAGNOSIS — D631 Anemia in chronic kidney disease: Secondary | ICD-10-CM | POA: Diagnosis not present

## 2013-10-17 DIAGNOSIS — D899 Disorder involving the immune mechanism, unspecified: Secondary | ICD-10-CM | POA: Diagnosis not present

## 2013-10-17 DIAGNOSIS — B349 Viral infection, unspecified: Secondary | ICD-10-CM | POA: Diagnosis not present

## 2013-10-30 DIAGNOSIS — B349 Viral infection, unspecified: Secondary | ICD-10-CM | POA: Diagnosis not present

## 2013-10-30 DIAGNOSIS — D899 Disorder involving the immune mechanism, unspecified: Secondary | ICD-10-CM | POA: Diagnosis not present

## 2013-10-30 DIAGNOSIS — N186 End stage renal disease: Secondary | ICD-10-CM | POA: Diagnosis not present

## 2013-10-30 DIAGNOSIS — I10 Essential (primary) hypertension: Secondary | ICD-10-CM | POA: Diagnosis not present

## 2013-10-30 DIAGNOSIS — I120 Hypertensive chronic kidney disease with stage 5 chronic kidney disease or end stage renal disease: Secondary | ICD-10-CM | POA: Diagnosis not present

## 2013-10-30 DIAGNOSIS — Z940 Kidney transplant status: Secondary | ICD-10-CM | POA: Diagnosis not present

## 2013-11-05 ENCOUNTER — Encounter: Payer: Self-pay | Admitting: General Practice

## 2013-11-21 ENCOUNTER — Other Ambulatory Visit: Payer: Self-pay

## 2013-11-21 DIAGNOSIS — Z1231 Encounter for screening mammogram for malignant neoplasm of breast: Secondary | ICD-10-CM

## 2013-11-28 DIAGNOSIS — B349 Viral infection, unspecified: Secondary | ICD-10-CM | POA: Diagnosis not present

## 2013-11-28 DIAGNOSIS — D899 Disorder involving the immune mechanism, unspecified: Secondary | ICD-10-CM | POA: Diagnosis not present

## 2013-11-28 DIAGNOSIS — Z48298 Encounter for aftercare following other organ transplant: Secondary | ICD-10-CM | POA: Diagnosis not present

## 2013-11-28 DIAGNOSIS — I10 Essential (primary) hypertension: Secondary | ICD-10-CM | POA: Diagnosis not present

## 2013-11-28 DIAGNOSIS — G2581 Restless legs syndrome: Secondary | ICD-10-CM | POA: Insufficient documentation

## 2013-11-28 DIAGNOSIS — Z79899 Other long term (current) drug therapy: Secondary | ICD-10-CM | POA: Diagnosis not present

## 2013-11-28 DIAGNOSIS — Z940 Kidney transplant status: Secondary | ICD-10-CM | POA: Diagnosis not present

## 2013-12-04 ENCOUNTER — Encounter: Payer: Self-pay | Admitting: General Practice

## 2013-12-12 DIAGNOSIS — Z48298 Encounter for aftercare following other organ transplant: Secondary | ICD-10-CM | POA: Diagnosis not present

## 2013-12-12 DIAGNOSIS — Z940 Kidney transplant status: Secondary | ICD-10-CM | POA: Diagnosis not present

## 2013-12-13 ENCOUNTER — Ambulatory Visit
Admission: RE | Admit: 2013-12-13 | Discharge: 2013-12-13 | Disposition: A | Discharge status: Home / Self Care | Payer: Medicare Other | Source: Ambulatory Visit

## 2013-12-13 DIAGNOSIS — Z1231 Encounter for screening mammogram for malignant neoplasm of breast: Secondary | ICD-10-CM

## 2013-12-26 DIAGNOSIS — Z79899 Other long term (current) drug therapy: Secondary | ICD-10-CM | POA: Diagnosis not present

## 2013-12-26 DIAGNOSIS — Z48298 Encounter for aftercare following other organ transplant: Secondary | ICD-10-CM | POA: Diagnosis not present

## 2013-12-26 DIAGNOSIS — Z940 Kidney transplant status: Secondary | ICD-10-CM | POA: Diagnosis not present

## 2013-12-31 DIAGNOSIS — Z01419 Encounter for gynecological examination (general) (routine) without abnormal findings: Secondary | ICD-10-CM | POA: Diagnosis not present

## 2014-01-03 ENCOUNTER — Encounter: Payer: Self-pay | Admitting: General Practice

## 2014-01-23 DIAGNOSIS — Q612 Polycystic kidney, adult type: Secondary | ICD-10-CM | POA: Diagnosis not present

## 2014-01-23 DIAGNOSIS — Z940 Kidney transplant status: Secondary | ICD-10-CM | POA: Diagnosis not present

## 2014-01-23 DIAGNOSIS — D801 Nonfamilial hypogammaglobulinemia: Secondary | ICD-10-CM | POA: Diagnosis not present

## 2014-01-23 DIAGNOSIS — D899 Disorder involving the immune mechanism, unspecified: Secondary | ICD-10-CM | POA: Diagnosis not present

## 2014-01-23 DIAGNOSIS — D72819 Decreased white blood cell count, unspecified: Secondary | ICD-10-CM | POA: Diagnosis not present

## 2014-01-23 DIAGNOSIS — T8610 Unspecified complication of kidney transplant: Secondary | ICD-10-CM | POA: Diagnosis not present

## 2014-01-23 DIAGNOSIS — B349 Viral infection, unspecified: Secondary | ICD-10-CM | POA: Diagnosis not present

## 2014-02-07 DIAGNOSIS — Z940 Kidney transplant status: Secondary | ICD-10-CM | POA: Diagnosis not present

## 2014-02-20 DIAGNOSIS — E785 Hyperlipidemia, unspecified: Secondary | ICD-10-CM | POA: Diagnosis not present

## 2014-02-20 DIAGNOSIS — D899 Disorder involving the immune mechanism, unspecified: Secondary | ICD-10-CM | POA: Diagnosis not present

## 2014-02-20 DIAGNOSIS — Z940 Kidney transplant status: Secondary | ICD-10-CM | POA: Diagnosis not present

## 2014-02-20 DIAGNOSIS — I10 Essential (primary) hypertension: Secondary | ICD-10-CM | POA: Diagnosis not present

## 2014-02-20 DIAGNOSIS — Z79899 Other long term (current) drug therapy: Secondary | ICD-10-CM | POA: Diagnosis not present

## 2014-02-20 DIAGNOSIS — Q612 Polycystic kidney, adult type: Secondary | ICD-10-CM | POA: Diagnosis not present

## 2014-02-20 DIAGNOSIS — B349 Viral infection, unspecified: Secondary | ICD-10-CM | POA: Diagnosis not present

## 2014-02-20 DIAGNOSIS — Z5181 Encounter for therapeutic drug level monitoring: Secondary | ICD-10-CM | POA: Diagnosis not present

## 2014-03-01 DIAGNOSIS — H60399 Other infective otitis externa, unspecified ear: Secondary | ICD-10-CM | POA: Diagnosis not present

## 2014-05-01 DIAGNOSIS — D899 Disorder involving the immune mechanism, unspecified: Secondary | ICD-10-CM | POA: Diagnosis not present

## 2014-05-01 DIAGNOSIS — Z940 Kidney transplant status: Secondary | ICD-10-CM | POA: Diagnosis not present

## 2014-05-29 DIAGNOSIS — B349 Viral infection, unspecified: Secondary | ICD-10-CM | POA: Diagnosis not present

## 2014-05-29 DIAGNOSIS — Z48298 Encounter for aftercare following other organ transplant: Secondary | ICD-10-CM | POA: Diagnosis not present

## 2014-05-29 DIAGNOSIS — Z940 Kidney transplant status: Secondary | ICD-10-CM | POA: Diagnosis not present

## 2014-05-29 DIAGNOSIS — D899 Disorder involving the immune mechanism, unspecified: Secondary | ICD-10-CM | POA: Diagnosis not present

## 2014-05-29 DIAGNOSIS — D72819 Decreased white blood cell count, unspecified: Secondary | ICD-10-CM | POA: Diagnosis not present

## 2014-05-29 DIAGNOSIS — T8610 Unspecified complication of kidney transplant: Secondary | ICD-10-CM | POA: Diagnosis not present

## 2014-07-04 DIAGNOSIS — Z940 Kidney transplant status: Secondary | ICD-10-CM | POA: Diagnosis not present

## 2014-07-04 DIAGNOSIS — E785 Hyperlipidemia, unspecified: Secondary | ICD-10-CM | POA: Diagnosis not present

## 2014-07-04 DIAGNOSIS — I10 Essential (primary) hypertension: Secondary | ICD-10-CM | POA: Diagnosis not present

## 2014-07-14 DIAGNOSIS — D485 Neoplasm of uncertain behavior of skin: Secondary | ICD-10-CM | POA: Diagnosis not present

## 2014-07-14 DIAGNOSIS — D2272 Melanocytic nevi of left lower limb, including hip: Secondary | ICD-10-CM | POA: Diagnosis not present

## 2014-07-14 DIAGNOSIS — L820 Inflamed seborrheic keratosis: Secondary | ICD-10-CM | POA: Diagnosis not present

## 2014-07-14 DIAGNOSIS — D692 Other nonthrombocytopenic purpura: Secondary | ICD-10-CM | POA: Diagnosis not present

## 2014-07-14 DIAGNOSIS — D225 Melanocytic nevi of trunk: Secondary | ICD-10-CM | POA: Diagnosis not present

## 2014-07-14 DIAGNOSIS — L570 Actinic keratosis: Secondary | ICD-10-CM | POA: Diagnosis not present

## 2014-07-14 DIAGNOSIS — L821 Other seborrheic keratosis: Secondary | ICD-10-CM | POA: Diagnosis not present

## 2014-07-14 DIAGNOSIS — D2271 Melanocytic nevi of right lower limb, including hip: Secondary | ICD-10-CM | POA: Diagnosis not present

## 2014-08-07 DIAGNOSIS — Z792 Long term (current) use of antibiotics: Secondary | ICD-10-CM | POA: Diagnosis not present

## 2014-08-07 DIAGNOSIS — Z940 Kidney transplant status: Secondary | ICD-10-CM | POA: Diagnosis not present

## 2014-08-07 DIAGNOSIS — D8989 Other specified disorders involving the immune mechanism, not elsewhere classified: Secondary | ICD-10-CM | POA: Diagnosis not present

## 2014-08-07 DIAGNOSIS — E785 Hyperlipidemia, unspecified: Secondary | ICD-10-CM | POA: Diagnosis not present

## 2014-08-07 DIAGNOSIS — I10 Essential (primary) hypertension: Secondary | ICD-10-CM | POA: Diagnosis not present

## 2014-08-07 DIAGNOSIS — D899 Disorder involving the immune mechanism, unspecified: Secondary | ICD-10-CM | POA: Diagnosis not present

## 2014-08-07 DIAGNOSIS — Z7982 Long term (current) use of aspirin: Secondary | ICD-10-CM | POA: Diagnosis not present

## 2014-08-07 DIAGNOSIS — Z7952 Long term (current) use of systemic steroids: Secondary | ICD-10-CM | POA: Diagnosis not present

## 2014-08-07 DIAGNOSIS — Z79899 Other long term (current) drug therapy: Secondary | ICD-10-CM | POA: Diagnosis not present

## 2014-08-07 DIAGNOSIS — B349 Viral infection, unspecified: Secondary | ICD-10-CM | POA: Diagnosis not present

## 2014-08-12 DIAGNOSIS — D485 Neoplasm of uncertain behavior of skin: Secondary | ICD-10-CM | POA: Diagnosis not present

## 2014-08-12 DIAGNOSIS — D235 Other benign neoplasm of skin of trunk: Secondary | ICD-10-CM | POA: Diagnosis not present

## 2014-08-14 DIAGNOSIS — E785 Hyperlipidemia, unspecified: Secondary | ICD-10-CM | POA: Diagnosis not present

## 2014-08-14 DIAGNOSIS — Z940 Kidney transplant status: Secondary | ICD-10-CM | POA: Diagnosis not present

## 2014-08-14 DIAGNOSIS — I10 Essential (primary) hypertension: Secondary | ICD-10-CM | POA: Diagnosis not present

## 2014-08-14 DIAGNOSIS — B349 Viral infection, unspecified: Secondary | ICD-10-CM | POA: Diagnosis not present

## 2014-09-18 DIAGNOSIS — I10 Essential (primary) hypertension: Secondary | ICD-10-CM | POA: Diagnosis not present

## 2014-09-18 DIAGNOSIS — E785 Hyperlipidemia, unspecified: Secondary | ICD-10-CM | POA: Diagnosis not present

## 2014-09-18 DIAGNOSIS — Z940 Kidney transplant status: Secondary | ICD-10-CM | POA: Diagnosis not present

## 2014-09-18 DIAGNOSIS — B349 Viral infection, unspecified: Secondary | ICD-10-CM | POA: Diagnosis not present

## 2014-11-19 DIAGNOSIS — T82590D Other mechanical complication of surgically created arteriovenous fistula, subsequent encounter: Secondary | ICD-10-CM | POA: Diagnosis not present

## 2014-11-19 DIAGNOSIS — D899 Disorder involving the immune mechanism, unspecified: Secondary | ICD-10-CM | POA: Diagnosis not present

## 2014-11-19 DIAGNOSIS — Q612 Polycystic kidney, adult type: Secondary | ICD-10-CM | POA: Diagnosis not present

## 2014-11-19 DIAGNOSIS — Z940 Kidney transplant status: Secondary | ICD-10-CM | POA: Diagnosis not present

## 2014-11-20 DIAGNOSIS — H02834 Dermatochalasis of left upper eyelid: Secondary | ICD-10-CM | POA: Diagnosis not present

## 2014-11-20 DIAGNOSIS — H02831 Dermatochalasis of right upper eyelid: Secondary | ICD-10-CM | POA: Diagnosis not present

## 2014-12-08 ENCOUNTER — Ambulatory Visit
Admit: 2014-12-08 | Disposition: A | Discharge status: Home / Self Care | Payer: Self-pay | Attending: Vascular Surgery | Admitting: Vascular Surgery

## 2014-12-08 DIAGNOSIS — Z7982 Long term (current) use of aspirin: Secondary | ICD-10-CM | POA: Diagnosis not present

## 2014-12-08 DIAGNOSIS — Z992 Dependence on renal dialysis: Secondary | ICD-10-CM | POA: Diagnosis not present

## 2014-12-08 DIAGNOSIS — N186 End stage renal disease: Secondary | ICD-10-CM | POA: Diagnosis not present

## 2014-12-08 DIAGNOSIS — Z79899 Other long term (current) drug therapy: Secondary | ICD-10-CM | POA: Diagnosis not present

## 2014-12-08 DIAGNOSIS — Z940 Kidney transplant status: Secondary | ICD-10-CM | POA: Diagnosis not present

## 2014-12-08 DIAGNOSIS — T82858A Stenosis of vascular prosthetic devices, implants and grafts, initial encounter: Secondary | ICD-10-CM | POA: Diagnosis not present

## 2014-12-26 DIAGNOSIS — E785 Hyperlipidemia, unspecified: Secondary | ICD-10-CM | POA: Diagnosis not present

## 2014-12-26 DIAGNOSIS — I10 Essential (primary) hypertension: Secondary | ICD-10-CM | POA: Diagnosis not present

## 2014-12-26 DIAGNOSIS — Q612 Polycystic kidney, adult type: Secondary | ICD-10-CM | POA: Diagnosis not present

## 2014-12-26 DIAGNOSIS — Z940 Kidney transplant status: Secondary | ICD-10-CM | POA: Diagnosis not present

## 2014-12-28 NOTE — Op Note (Signed)
PATIENT NAME:  Megan Small, Megan Small MR#:  [id number] DATE OF BIRTH:  1950/09/22  DATE OF PROCEDURE:  03/01/2012  PREOPERATIVE DIAGNOSES:  1. Chronic kidney disease nearing dialysis dependence.  2. Poorly maturing right arm AV fistula.   POSTOPERATIVE DIAGNOSES:  1. Chronic kidney disease nearing dialysis dependence.  2. Poorly maturing right arm AV fistula.   PROCEDURES:  1. Ultrasound guidance for vascular access, right arm AV fistula.  2. Right upper extremity fistulogram.  3. Percutaneous transluminal angioplasty of perianastomotic stenosis with 6 mm diameter conventional and high-pressure angioplasty balloon.   SURGEON: Algernon Huxley, MD  ANESTHESIA: Local with moderate conscious sedation.   ESTIMATED BLOOD LOSS: Approximately 25 mL.   INDICATION FOR PROCEDURE: 64 year old white female with chronic kidney disease due to adult polycystic kidney disease. She is not yet on dialysis. Her fistula has failed to mature and noninvasive studies suggest a stenosis not far beyond the anastomosis. I recommended fistulogram and further evaluation. Risks and benefits were discussed. Informed consent was obtained.   DESCRIPTION OF PROCEDURE: Patient is brought to the vascular interventional radiology suite. Right upper extremity is sterilely prepped and draped, a sterile surgical field was created. The fistula was accessed under direct ultrasound guidance in a retrograde fashion in the mid upper arm. A micropuncture wire and sheath were placed. We upsized to a 6 [place] sheath. Imaging through this showed a high-grade stenosis in the swing portion of the paraanastomotic fistula. This is within the cephalic vein just beyond the anastomosis for several centimeters. I crossed the lesion without difficulty with a Magic torque wire, gave a small dose of intravenous heparin, treated this area with 6 mm diameter conventional and then a high-pressure angioplasty balloon. A tight waste was taken which resolved and  completion angiogram following this showed resolution of the stenosis with improved flow with some bleeding around the sheath. The sheath was then removed around a 4-0 Monocryl pursestring suture. Pressure was held and sterile dressing was placed. The patient tolerated procedure well and was taken to recovery room in stable condition.   TOTAL CONTRAST USED: 15 mL.  FLUOROSCOPY TIME: Approximately 2 minutes.   ____________________________ Algernon Huxley, MD jsd:cms D: 03/01/2012 10:05:03 ET T: 03/01/2012 10:57:08 ET JOB#: [id number]  cc: Algernon Huxley, MD, <Dictator> [name], MD Algernon Huxley MD ELECTRONICALLY SIGNED 03/01/2012 14:26

## 2014-12-28 NOTE — Op Note (Signed)
PATIENT NAME:  Megan Small, Megan Small MR#:  [id number] DATE OF BIRTH:  [birth year]  DATE OF PROCEDURE:  12/16/2011  PREOPERATIVE DIAGNOSIS: Left shoulder impingement syndrome.   POSTOPERATIVE DIAGNOSIS:  1. Left shoulder impingement syndrome.  2. Adhesive capsulitis, left shoulder.   PROCEDURE PERFORMED: Left subacromial decompression and manipulation of the left shoulder under anesthesia.   SURGEON: Laurice Record. Holley Bouche., MD   ANESTHESIA: Interscalene block and general.   ESTIMATED BLOOD LOSS: Minimal.   DRAINS: None.   INDICATIONS FOR SURGERY: The patient is a 64 year old female who has been seen for complaints of left shoulder pain and restricted range of motion. MRI demonstrated findings consistent with impingement-type anatomy and severe tendinosis of the supraspinatus tendon. After discussion of the risks and benefits of surgical intervention, the patient expressed her understanding of the risks and benefits and agreed with plans for surgical intervention.   PROCEDURE IN DETAIL: The patient was brought to the Operating Room, and after adequate interscalene block and general anesthesia was achieved, the patient was placed in the modified beach chair position. The head was secured in a headrest, and all bony prominences were well padded. The patient's left shoulder and arm were cleaned and prepped with alcohol and DuraPrep and draped in the usual sterile fashion. A "timeout" was performed as per usual protocol. The patient's left shoulder was taken through a range of motion with noted restriction with abduction, flexion, and external rotation. The shoulder was gently manipulated with palpable lysis of adhesions and a subsequent restoration of full passive range of motion of the shoulder.   The anticipated incision site was checked with 0.25% Marcaine with epinephrine. An anterior oblique incision was made roughly bisecting the anterior aspect of the acromion. The deltoid was elevated in  periosteal  fashion off of the acromion. The subdeltoid bursa was excised. The shoulder was placed through a range of motion. The rotator cuff appeared to be in good condition. A Darrach retractor was placed in the wound so as to protect the cuff. An anterior-inferior wafer of bone of approximately 4 mm in thickness was removed from the subacromial space. Additional debridement and contouring was performed using a TPS high-speed rasp. The wound was irrigated with copious amounts of normal saline with antibiotic solution. Excellent decompression of the subacromial space was appreciated. The shoulder was injected with approximately 15 mL of 0.25% Marcaine with epinephrine and then placed through a range of motion. There was no extravasation of fluid. The wound was again irrigated. Good hemostasis was appreciated. The deltoid was repaired in a side-to-side fashion using interrupted sutures of #1 Ethibond. The subcutaneous tissue was approximated in layers using first #0 Vicryl, followed by #2-0 Vicryl. The skin was closed with running subcuticular suture of #4 Vicryl. Steri-Strips were applied. A sterile dressing was applied, followed by application of a sling and ice pack.   The patient tolerated the procedure well. She was transported to the recovery room in stable condition.    ____________________________ Laurice Record. Holley Bouche., MD jph:cbb D: 12/17/2011 00:05:32 ET T: 12/17/2011 15:55:45 ET JOB#: [id number]  cc: Laurice Record. Holley Bouche., MD, <Dictator> JAMES P Holley Bouche MD ELECTRONICALLY SIGNED 12/18/2011 20:25

## 2015-01-04 NOTE — Op Note (Signed)
PATIENT NAME:  Megan Small, Megan Small MR#:  [id number] DATE OF BIRTH:  [birth year]  DATE OF PROCEDURE:  12/08/2014  PREOPERATIVE DIAGNOSES:  1. End-stage renal disease.  2. 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  3. Large cephalic vein branch creating competitive flow limiting fistula maturation.  4. Stenosis of cephalic vein near the anastomosis in the cephalic vein and subclavian vein confluence seen on duplex.   POSTOPERATIVE DIAGNOSES:  1. End-stage renal disease.  2. 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  3. Large cephalic vein branch creating competitive flow limiting fistula maturation.  4. Stenosis of cephalic vein near the anastomosis in the cephalic vein and subclavian vein confluence seen on duplex.   PROCEDURES:  1. Ultrasound guidance for vascular access to right brachiocephalic arteriovenous fistula.  2. Right upper extremity fistulogram and central venogram.  3. Percutaneous transluminal angioplasty of cephalic vein and subclavian vein confluence with 8 mm diameter angioplasty balloon.  4. Coil embolization of large cephalic vein branch with two 8 mm diameter, 5 cm in length coils to decrease competitive flow.   SURGEON: Algernon Huxley, M.D.   ANESTHESIA: Local with moderate conscious sedation.   ESTIMATED BLOOD LOSS: Minimal.   INDICATION FOR PROCEDURE: A 64 year old female with an AV fistula created for permanent dialysis access. She has received a cadaveric renal transplant and is not currently on dialysis, but has had issues with the BK virus, and has needed her fistula for plasmapheresis, and concern over the longevity of the renal transplant is present, and we need to ensure that her fistula will be a functional dialysis access if needed. She has a large medial branch causing competitive flow that has been noted to be enlarging over time. She also has stenoses near the cephalic vein and subclavian confluence and near the  anastomosis that been moderate and been followed by duplex. With elevation of these velocities, and after discussion with her transplant nephrologist, as well as her regular nephrologist, intervention on this fistula was recommended. The risks and benefits were discussed. Informed consent was obtained.   DESCRIPTION OF PROCEDURE: The patient was brought to the vascular suite. The right upper extremity was sterilely prepped and draped and a sterile surgical field was created. I accessed near the anastomosis of the artery and the cephalic vein. Under direct ultrasound guidance with a micropuncture needle, a micropuncture wire and sheath were placed and a permanent image was recorded. We upsized to a 6 [place] sheath. Imaging with compression of the fistula showed the peri-anastomotic stenosis to be in the 50% range. We then imaged the upper arm, the large competitive branch did clearly steal a significant amount of flow from the fistula and drained into the basilic vein in the upper arm. There was then about a 60% to 70% stenosis with a napkin ring type lesion at the cephalic vein and subclavian vein confluence. The remainder of the central venous circulation was patent. I crossed the stenosis at the cephalic vein and subclavian vein confluence without difficulty and treated this with an 8 mm diameter angioplasty balloon. Narrowing was seen, which resolved with angioplasty. There was still some residual stenosis of about 20% to 30% that did not appear flow limiting after intervention.   I then turned my attention to treating the large competitive branch, which was likely the biggest issue at the fistula. I cannulated with a RIM catheter and a Glidewire. I then exchanged for a short Kumpe catheter well into the branch. I then  used two 8 mm diameter short 5 cm Nester coils to coil embolize this branch well before it entered into the basilic vein so there would be no embolization through the basilic vein, and about 3  or 4 cm from the origin off the cephalic vein to make sure this would not create any thrombus or problems within the cephalic vein itself. Imaging through the sheath showed that this branch had been successfully embolized and flow was excluded, and, at this point, I decided not to treat the borderline stenosis near the arterial anastomosis, which would require a retrograde access and further use of contrast as we want to limit the amount of contrast on her transplanted kidney. We used a total of 25 mL of contrast for the case. I did not really want to use any further for a borderline lesion.   I removed the sheath; 4-0 Monocryl pursestring suture was placed. Pressure was held. Sterile dressing was placed. The patient tolerated the procedure well and was taken to the recovery room in stable condition.    ____________________________ Algernon Huxley, MD jsd:JT D: 12/08/2014 12:07:13 ET T: 12/08/2014 12:29:59 ET JOB#: [id number]  cc: Algernon Huxley, MD, <Dictator> [name], MD Algernon Huxley MD ELECTRONICALLY SIGNED 12/08/2014 16:38

## 2015-01-08 DIAGNOSIS — Z940 Kidney transplant status: Secondary | ICD-10-CM | POA: Diagnosis not present

## 2015-02-13 DIAGNOSIS — N281 Cyst of kidney, acquired: Secondary | ICD-10-CM | POA: Diagnosis not present

## 2015-02-13 DIAGNOSIS — Z7982 Long term (current) use of aspirin: Secondary | ICD-10-CM | POA: Diagnosis not present

## 2015-02-13 DIAGNOSIS — Z940 Kidney transplant status: Secondary | ICD-10-CM | POA: Diagnosis not present

## 2015-02-26 DIAGNOSIS — Z940 Kidney transplant status: Secondary | ICD-10-CM | POA: Diagnosis not present

## 2015-03-04 DIAGNOSIS — Z940 Kidney transplant status: Secondary | ICD-10-CM | POA: Diagnosis not present

## 2015-03-04 DIAGNOSIS — E785 Hyperlipidemia, unspecified: Secondary | ICD-10-CM | POA: Diagnosis not present

## 2015-03-04 DIAGNOSIS — Q612 Polycystic kidney, adult type: Secondary | ICD-10-CM | POA: Diagnosis not present

## 2015-03-04 DIAGNOSIS — I10 Essential (primary) hypertension: Secondary | ICD-10-CM | POA: Diagnosis not present

## 2015-03-05 DIAGNOSIS — H02831 Dermatochalasis of right upper eyelid: Secondary | ICD-10-CM | POA: Diagnosis not present

## 2015-03-05 DIAGNOSIS — H02834 Dermatochalasis of left upper eyelid: Secondary | ICD-10-CM | POA: Diagnosis not present

## 2015-03-27 DIAGNOSIS — I10 Essential (primary) hypertension: Secondary | ICD-10-CM | POA: Diagnosis not present

## 2015-03-27 DIAGNOSIS — E785 Hyperlipidemia, unspecified: Secondary | ICD-10-CM | POA: Diagnosis not present

## 2015-03-27 DIAGNOSIS — Z1389 Encounter for screening for other disorder: Secondary | ICD-10-CM | POA: Diagnosis not present

## 2015-03-27 DIAGNOSIS — Z0000 Encounter for general adult medical examination without abnormal findings: Secondary | ICD-10-CM | POA: Diagnosis not present

## 2015-03-27 DIAGNOSIS — Z940 Kidney transplant status: Secondary | ICD-10-CM | POA: Diagnosis not present

## 2015-03-31 DIAGNOSIS — Z940 Kidney transplant status: Secondary | ICD-10-CM | POA: Diagnosis not present

## 2015-04-29 DIAGNOSIS — Z940 Kidney transplant status: Secondary | ICD-10-CM | POA: Diagnosis not present

## 2015-05-06 DIAGNOSIS — Q612 Polycystic kidney, adult type: Secondary | ICD-10-CM | POA: Diagnosis not present

## 2015-05-06 DIAGNOSIS — B349 Viral infection, unspecified: Secondary | ICD-10-CM | POA: Diagnosis not present

## 2015-05-06 DIAGNOSIS — Z940 Kidney transplant status: Secondary | ICD-10-CM | POA: Diagnosis not present

## 2015-05-06 DIAGNOSIS — I10 Essential (primary) hypertension: Secondary | ICD-10-CM | POA: Diagnosis not present

## 2015-06-04 DIAGNOSIS — Z23 Encounter for immunization: Secondary | ICD-10-CM | POA: Diagnosis not present

## 2015-06-04 DIAGNOSIS — N189 Chronic kidney disease, unspecified: Secondary | ICD-10-CM | POA: Diagnosis not present

## 2015-06-04 DIAGNOSIS — Z4822 Encounter for aftercare following kidney transplant: Secondary | ICD-10-CM | POA: Diagnosis not present

## 2015-06-04 DIAGNOSIS — B349 Viral infection, unspecified: Secondary | ICD-10-CM | POA: Diagnosis not present

## 2015-06-04 DIAGNOSIS — Z888 Allergy status to other drugs, medicaments and biological substances status: Secondary | ICD-10-CM | POA: Diagnosis not present

## 2015-06-04 DIAGNOSIS — D899 Disorder involving the immune mechanism, unspecified: Secondary | ICD-10-CM | POA: Diagnosis not present

## 2015-06-04 DIAGNOSIS — I129 Hypertensive chronic kidney disease with stage 1 through stage 4 chronic kidney disease, or unspecified chronic kidney disease: Secondary | ICD-10-CM | POA: Diagnosis not present

## 2015-06-30 DIAGNOSIS — N2581 Secondary hyperparathyroidism of renal origin: Secondary | ICD-10-CM | POA: Diagnosis not present

## 2015-06-30 DIAGNOSIS — Z940 Kidney transplant status: Secondary | ICD-10-CM | POA: Diagnosis not present

## 2015-07-27 DIAGNOSIS — Z940 Kidney transplant status: Secondary | ICD-10-CM | POA: Diagnosis not present

## 2015-07-27 DIAGNOSIS — N2581 Secondary hyperparathyroidism of renal origin: Secondary | ICD-10-CM | POA: Diagnosis not present

## 2015-07-29 DIAGNOSIS — I10 Essential (primary) hypertension: Secondary | ICD-10-CM | POA: Diagnosis not present

## 2015-07-29 DIAGNOSIS — Q612 Polycystic kidney, adult type: Secondary | ICD-10-CM | POA: Diagnosis not present

## 2015-07-29 DIAGNOSIS — B349 Viral infection, unspecified: Secondary | ICD-10-CM | POA: Diagnosis not present

## 2015-07-29 DIAGNOSIS — E785 Hyperlipidemia, unspecified: Secondary | ICD-10-CM | POA: Diagnosis not present

## 2015-07-29 DIAGNOSIS — I770 Arteriovenous fistula, acquired: Secondary | ICD-10-CM | POA: Diagnosis not present

## 2015-07-29 DIAGNOSIS — Z940 Kidney transplant status: Secondary | ICD-10-CM | POA: Diagnosis not present

## 2015-08-04 ENCOUNTER — Other Ambulatory Visit: Payer: Self-pay

## 2015-08-04 DIAGNOSIS — Z1231 Encounter for screening mammogram for malignant neoplasm of breast: Secondary | ICD-10-CM

## 2015-08-05 ENCOUNTER — Ambulatory Visit
Admission: RE | Admit: 2015-08-05 | Discharge: 2015-08-05 | Disposition: A | Discharge status: Home / Self Care | Payer: Medicare Other | Source: Ambulatory Visit

## 2015-08-05 DIAGNOSIS — Z1231 Encounter for screening mammogram for malignant neoplasm of breast: Secondary | ICD-10-CM

## 2015-08-24 DIAGNOSIS — D229 Melanocytic nevi, unspecified: Secondary | ICD-10-CM | POA: Diagnosis not present

## 2015-08-24 DIAGNOSIS — C44311 Basal cell carcinoma of skin of nose: Secondary | ICD-10-CM | POA: Diagnosis not present

## 2015-08-24 DIAGNOSIS — L570 Actinic keratosis: Secondary | ICD-10-CM | POA: Diagnosis not present

## 2015-08-24 DIAGNOSIS — D485 Neoplasm of uncertain behavior of skin: Secondary | ICD-10-CM | POA: Diagnosis not present

## 2015-08-24 DIAGNOSIS — L821 Other seborrheic keratosis: Secondary | ICD-10-CM | POA: Diagnosis not present

## 2015-08-26 DIAGNOSIS — E785 Hyperlipidemia, unspecified: Secondary | ICD-10-CM | POA: Diagnosis not present

## 2015-08-28 DIAGNOSIS — D8989 Other specified disorders involving the immune mechanism, not elsewhere classified: Secondary | ICD-10-CM | POA: Diagnosis not present

## 2015-08-28 DIAGNOSIS — B9789 Other viral agents as the cause of diseases classified elsewhere: Secondary | ICD-10-CM | POA: Diagnosis not present

## 2015-08-28 DIAGNOSIS — Z4822 Encounter for aftercare following kidney transplant: Secondary | ICD-10-CM | POA: Diagnosis not present

## 2015-08-28 DIAGNOSIS — Z7982 Long term (current) use of aspirin: Secondary | ICD-10-CM | POA: Diagnosis not present

## 2015-08-28 DIAGNOSIS — D899 Disorder involving the immune mechanism, unspecified: Secondary | ICD-10-CM | POA: Diagnosis not present

## 2015-08-28 DIAGNOSIS — Z888 Allergy status to other drugs, medicaments and biological substances status: Secondary | ICD-10-CM | POA: Diagnosis not present

## 2015-08-28 DIAGNOSIS — I129 Hypertensive chronic kidney disease with stage 1 through stage 4 chronic kidney disease, or unspecified chronic kidney disease: Secondary | ICD-10-CM | POA: Diagnosis not present

## 2015-08-28 DIAGNOSIS — Z940 Kidney transplant status: Secondary | ICD-10-CM | POA: Diagnosis not present

## 2015-08-28 DIAGNOSIS — Z79899 Other long term (current) drug therapy: Secondary | ICD-10-CM | POA: Diagnosis not present

## 2015-08-28 DIAGNOSIS — R05 Cough: Secondary | ICD-10-CM | POA: Diagnosis not present

## 2015-08-28 DIAGNOSIS — N189 Chronic kidney disease, unspecified: Secondary | ICD-10-CM | POA: Diagnosis not present

## 2015-09-09 ENCOUNTER — Encounter: Payer: Self-pay | Admitting: *Deleted

## 2015-09-09 ENCOUNTER — Ambulatory Visit
Admission: RE | Admit: 2015-09-09 | Discharge: 2015-09-09 | Disposition: A | Discharge status: Home / Self Care | Payer: 59 | Source: Ambulatory Visit | Attending: Vascular Surgery | Admitting: Vascular Surgery

## 2015-09-09 ENCOUNTER — Encounter
Admission: RE | Disposition: A | Discharge status: Home / Self Care | Payer: Self-pay | Source: Ambulatory Visit | Attending: Vascular Surgery

## 2015-09-09 DIAGNOSIS — N186 End stage renal disease: Secondary | ICD-10-CM | POA: Insufficient documentation

## 2015-09-09 DIAGNOSIS — Z940 Kidney transplant status: Secondary | ICD-10-CM | POA: Insufficient documentation

## 2015-09-09 DIAGNOSIS — Z992 Dependence on renal dialysis: Secondary | ICD-10-CM | POA: Insufficient documentation

## 2015-09-09 DIAGNOSIS — Q612 Polycystic kidney, adult type: Secondary | ICD-10-CM | POA: Insufficient documentation

## 2015-09-09 DIAGNOSIS — Z79899 Other long term (current) drug therapy: Secondary | ICD-10-CM | POA: Insufficient documentation

## 2015-09-09 DIAGNOSIS — T82858A Stenosis of vascular prosthetic devices, implants and grafts, initial encounter: Secondary | ICD-10-CM | POA: Diagnosis not present

## 2015-09-09 DIAGNOSIS — I10 Essential (primary) hypertension: Secondary | ICD-10-CM | POA: Diagnosis not present

## 2015-09-09 DIAGNOSIS — C44301 Unspecified malignant neoplasm of skin of nose: Secondary | ICD-10-CM | POA: Insufficient documentation

## 2015-09-09 DIAGNOSIS — Y832 Surgical operation with anastomosis, bypass or graft as the cause of abnormal reaction of the patient, or of later complication, without mention of misadventure at the time of the procedure: Secondary | ICD-10-CM | POA: Diagnosis not present

## 2015-09-09 DIAGNOSIS — T82318A Breakdown (mechanical) of other vascular grafts, initial encounter: Secondary | ICD-10-CM | POA: Diagnosis not present

## 2015-09-09 HISTORY — DX: Personal history of other diseases of the digestive system: Z87.19

## 2015-09-09 HISTORY — DX: Essential (primary) hypertension: I10

## 2015-09-09 HISTORY — DX: Hyperlipidemia, unspecified: E78.5

## 2015-09-09 HISTORY — DX: Chronic kidney disease, unspecified: N18.9

## 2015-09-09 HISTORY — DX: Disease of blood and blood-forming organs, unspecified: D75.9

## 2015-09-09 HISTORY — PX: PERIPHERAL VASCULAR CATHETERIZATION: SHX172C

## 2015-09-09 SURGERY — A/V SHUNTOGRAM/FISTULAGRAM
Anesthesia: Moderate Sedation

## 2015-09-09 MED ORDER — SODIUM CHLORIDE 0.9 % IV SOLN
INTRAVENOUS | Status: DC
  Administered 2015-09-09: 09:00:00 via INTRAVENOUS

## 2015-09-09 MED ORDER — LIDOCAINE-EPINEPHRINE (PF) 1 %-1:200000 IJ SOLN
INTRAMUSCULAR | Status: DC | PRN
  Administered 2015-09-09: 10 mL via INTRADERMAL

## 2015-09-09 MED ORDER — SODIUM BICARBONATE 8.4 % IV SOLN
INTRAVENOUS | Status: DC
  Filled 2015-09-09: qty 1000

## 2015-09-09 MED ORDER — FENTANYL CITRATE (PF) 100 MCG/2ML IJ SOLN
INTRAMUSCULAR | Status: DC | PRN
  Administered 2015-09-09 (×2): 50 ug via INTRAVENOUS

## 2015-09-09 MED ORDER — HEPARIN (PORCINE) IN NACL 2-0.9 UNIT/ML-% IJ SOLN
INTRAMUSCULAR | Status: AC
  Filled 2015-09-09: qty 1000

## 2015-09-09 MED ORDER — MIDAZOLAM HCL 2 MG/2ML IJ SOLN
INTRAMUSCULAR | Status: DC | PRN
  Administered 2015-09-09: 1 mg via INTRAVENOUS
  Administered 2015-09-09: 2 mg via INTRAVENOUS

## 2015-09-09 MED ORDER — SODIUM BICARBONATE BOLUS VIA INFUSION
INTRAVENOUS | Status: DC
  Filled 2015-09-09: qty 1

## 2015-09-09 MED ORDER — LIDOCAINE-EPINEPHRINE (PF) 1 %-1:200000 IJ SOLN
INTRAMUSCULAR | Status: AC
  Filled 2015-09-09: qty 30

## 2015-09-09 MED ORDER — FENTANYL CITRATE (PF) 100 MCG/2ML IJ SOLN
INTRAMUSCULAR | Status: AC
  Filled 2015-09-09: qty 2

## 2015-09-09 MED ORDER — IOHEXOL 300 MG/ML  SOLN
INTRAMUSCULAR | Status: DC | PRN
  Administered 2015-09-09: 35 mL

## 2015-09-09 MED ORDER — HEPARIN SODIUM (PORCINE) 1000 UNIT/ML IJ SOLN
INTRAMUSCULAR | Status: DC | PRN
  Administered 2015-09-09: 3000 [IU] via INTRAVENOUS

## 2015-09-09 MED ORDER — HEPARIN SODIUM (PORCINE) 1000 UNIT/ML IJ SOLN
INTRAMUSCULAR | Status: AC
  Filled 2015-09-09: qty 1

## 2015-09-09 MED ORDER — MIDAZOLAM HCL 5 MG/5ML IJ SOLN
INTRAMUSCULAR | Status: AC
  Filled 2015-09-09: qty 5

## 2015-09-09 MED ORDER — SODIUM BICARBONATE BOLUS VIA INFUSION
INTRAVENOUS | Status: AC
  Administered 2015-09-09: 11:00:00 via INTRAVENOUS
  Filled 2015-09-09: qty 1

## 2015-09-09 SURGICAL SUPPLY — 12 items
BALLN ARMADA 9X40X80 (BALLOONS) ×2
BALLN DORADO 8X60X80 (BALLOONS) ×2
BALLOON ARMADA 9X40X80 (BALLOONS) ×1 IMPLANT
BALLOON DORADO 8X60X80 (BALLOONS) ×1 IMPLANT
CANNULA 5F STIFF (CANNULA) ×2 IMPLANT
CATH KMP 5X40 (CATHETERS) ×2 IMPLANT
DEVICE PRESTO INFLATION (MISCELLANEOUS) ×2 IMPLANT
DRAPE BRACHIAL (DRAPES) ×2 IMPLANT
PACK ANGIOGRAPHY (CUSTOM PROCEDURE TRAY) ×2 IMPLANT
SHEATH BRITE TIP 6FRX5.5 (SHEATH) ×2 IMPLANT
TOWEL OR 17X26 4PK STRL BLUE (TOWEL DISPOSABLE) ×2 IMPLANT
WIRE MAGIC TORQUE 260C (WIRE) ×2 IMPLANT

## 2015-09-09 NOTE — H&P (Signed)
  [facility] History & Physical Update  The patient was interviewed and re-examined.  The patient's previous History and Physical has been reviewed and is unchanged.  There is no change in the plan of care. We plan to proceed with the scheduled procedure.  DEW,JASON, MD  09/09/2015, 9:29 AM

## 2015-09-09 NOTE — Op Note (Signed)
[facility]    OPERATIVE NOTE   PROCEDURE: 1.   Right brachiocephalic arteriovenous fistula cannulation under ultrasound guidance 2.   right arm fistulagram including central venogram 3.   Percutaneous transluminal angioplasty of distal upper arm cephalic vein stenosis with 9 mm diameter by 4 cm length angioplasty balloon 4.   Percutaneous transluminal angioplasty of cephalic vein subclavian vein confluence with 8 mm diameter by 4 cm length high pressure angioplasty balloon and 9 mm diameter by 4 cm length conventional angioplasty balloon   PRE-OPERATIVE DIAGNOSIS: 1. ESRD, status post renal transplant  2. Poorly functional right brachiocephalic  AVF  POST-OPERATIVE DIAGNOSIS: same as above   SURGEON: Leotis Pain, MD  ANESTHESIA: local with MCS  ESTIMATED BLOOD LOSS: 25 cc  FINDING(S): 1. Stenosis as above, both between 70-80%  SPECIMEN(S):  None  CONTRAST: 35 cc  INDICATIONS: Megan Small is a 65 y.o. female who presents with malfunctioning right brachiocephalic arteriovenous fistula.  The patient is scheduled for right arm fistulagram.  The patient is aware the risks include but are not limited to: bleeding, infection, thrombosis of the cannulated access, and possible anaphylactic reaction to the contrast.  The patient is aware of the risks of the procedure and elects to proceed forward.  DESCRIPTION: After full informed written consent was obtained, the patient was brought back to the angiography suite and placed supine upon the angiography table.  The patient was connected to monitoring equipment.  The right arm was prepped and draped in the standard fashion for a percutaneous access intervention.  Under ultrasound guidance, the right brachiocephalic arteriovenous fistula was cannulated with a micropuncture needle under direct ultrasound guidance and a permanent image was performed.  The microwire was advanced into the fistula and the needle was exchanged  for the a microsheath.  I then upsized to a 6 Fr Sheath and imaging was performed.  Hand injections were completed to image the access including the central venous system. This demonstrated a 70-80% stenosis in the distal upper arm cephalic vein in what would likely be the arterial access site. There was then a separate and distinct stenosis in the more central vein region with another 70-80% stenosis at the cephalic vein subclavian vein confluence.  Based on the images, this patient will need intervention to both areas. I then gave the patient 3000 units of intravenous heparin.  I then crossed the stenoses with a Magic Tourqe wire and a Kumpe catheter.  Based on the imaging, a 8 mm x 4 cm  high pressure angioplasty balloon angioplasty balloon was selected for the central lesion.  The balloon was centered around the cephalic vein subclavian vein confluence stenosis and inflated to 16 ATM for 1 minute(s).  On completion imaging, a 50 % residual stenosis was present.  I then upsized to a 9 mm diameter by 4 cm length conventional angioplasty balloon in this location. This was inflated to 12 atm for 1 minute. On completion imaging, a 20-25% residual stenosis was present I then turned my attention to the distal upper arm cephalic vein stenosis. This lesion was treated with a 9 mm diameter by 4 cm length angioplasty balloon. This was inflated to 12 atm for 1 minute. On completion imaging, a 20% residual stenosis was identified in this location.  Based on the completion imaging, no further intervention is necessary.  The wire and balloon were removed from the sheath.  A 4-0 Monocryl purse-string suture was sewn around the sheath.  The sheath  was removed while tying down the suture.  A sterile bandage was applied to the puncture site.  COMPLICATIONS: None  CONDITION: Stable   Megan Small  09/09/2015 10:53 AM

## 2015-09-10 ENCOUNTER — Encounter: Payer: Self-pay | Admitting: Vascular Surgery

## 2015-09-23 DIAGNOSIS — Z940 Kidney transplant status: Secondary | ICD-10-CM | POA: Diagnosis not present

## 2015-09-24 DIAGNOSIS — C44311 Basal cell carcinoma of skin of nose: Secondary | ICD-10-CM | POA: Diagnosis not present

## 2015-10-09 DIAGNOSIS — I10 Essential (primary) hypertension: Secondary | ICD-10-CM | POA: Diagnosis not present

## 2015-10-09 DIAGNOSIS — E785 Hyperlipidemia, unspecified: Secondary | ICD-10-CM | POA: Diagnosis not present

## 2015-10-09 DIAGNOSIS — Q612 Polycystic kidney, adult type: Secondary | ICD-10-CM | POA: Diagnosis not present

## 2015-10-09 DIAGNOSIS — B349 Viral infection, unspecified: Secondary | ICD-10-CM | POA: Diagnosis not present

## 2015-10-09 DIAGNOSIS — Z85828 Personal history of other malignant neoplasm of skin: Secondary | ICD-10-CM | POA: Diagnosis not present

## 2015-10-09 DIAGNOSIS — I770 Arteriovenous fistula, acquired: Secondary | ICD-10-CM | POA: Diagnosis not present

## 2015-10-09 DIAGNOSIS — Z940 Kidney transplant status: Secondary | ICD-10-CM | POA: Diagnosis not present

## 2015-10-27 DIAGNOSIS — Z992 Dependence on renal dialysis: Secondary | ICD-10-CM | POA: Diagnosis not present

## 2015-10-27 DIAGNOSIS — E785 Hyperlipidemia, unspecified: Secondary | ICD-10-CM | POA: Diagnosis not present

## 2015-10-27 DIAGNOSIS — T82318A Breakdown (mechanical) of other vascular grafts, initial encounter: Secondary | ICD-10-CM | POA: Diagnosis not present

## 2015-10-27 DIAGNOSIS — T82858A Stenosis of vascular prosthetic devices, implants and grafts, initial encounter: Secondary | ICD-10-CM | POA: Diagnosis not present

## 2015-10-28 DIAGNOSIS — Z940 Kidney transplant status: Secondary | ICD-10-CM | POA: Diagnosis not present

## 2015-11-24 DIAGNOSIS — Z940 Kidney transplant status: Secondary | ICD-10-CM | POA: Diagnosis not present

## 2015-11-24 DIAGNOSIS — Z7689 Persons encountering health services in other specified circumstances: Secondary | ICD-10-CM | POA: Diagnosis not present

## 2015-11-24 DIAGNOSIS — N2581 Secondary hyperparathyroidism of renal origin: Secondary | ICD-10-CM | POA: Diagnosis not present

## 2015-12-28 ENCOUNTER — Encounter
Admission: RE | Admit: 2015-12-28 | Discharge: 2015-12-28 | Disposition: A | Discharge status: Home / Self Care | Payer: 59 | Source: Ambulatory Visit | Attending: Internal Medicine | Admitting: Internal Medicine

## 2015-12-28 ENCOUNTER — Ambulatory Visit
Admission: RE | Admit: 2015-12-28 | Discharge: 2015-12-28 | Disposition: A | Discharge status: Home / Self Care | Payer: 59 | Source: Ambulatory Visit | Attending: Internal Medicine | Admitting: Internal Medicine

## 2015-12-28 ENCOUNTER — Other Ambulatory Visit (HOSPITAL_COMMUNITY): Payer: Self-pay | Admitting: Internal Medicine

## 2015-12-28 DIAGNOSIS — R7989 Other specified abnormal findings of blood chemistry: Secondary | ICD-10-CM

## 2015-12-28 DIAGNOSIS — R0609 Other forms of dyspnea: Secondary | ICD-10-CM

## 2015-12-28 DIAGNOSIS — J9811 Atelectasis: Secondary | ICD-10-CM | POA: Diagnosis not present

## 2015-12-28 DIAGNOSIS — R791 Abnormal coagulation profile: Secondary | ICD-10-CM | POA: Insufficient documentation

## 2015-12-28 DIAGNOSIS — R0602 Shortness of breath: Secondary | ICD-10-CM | POA: Diagnosis not present

## 2015-12-31 DIAGNOSIS — Z940 Kidney transplant status: Secondary | ICD-10-CM | POA: Diagnosis not present

## 2015-12-31 DIAGNOSIS — E785 Hyperlipidemia, unspecified: Secondary | ICD-10-CM | POA: Diagnosis not present

## 2016-01-04 ENCOUNTER — Ambulatory Visit (INDEPENDENT_AMBULATORY_CARE_PROVIDER_SITE_OTHER): Payer: Medicare Other | Admitting: Internal Medicine

## 2016-01-04 ENCOUNTER — Encounter: Payer: Self-pay | Admitting: Internal Medicine

## 2016-01-04 VITALS — BP 114/62 | HR 78 | Ht 65.0 in | Wt 215.0 lb

## 2016-01-04 DIAGNOSIS — Z8249 Family history of ischemic heart disease and other diseases of the circulatory system: Secondary | ICD-10-CM

## 2016-01-04 DIAGNOSIS — R0602 Shortness of breath: Secondary | ICD-10-CM

## 2016-01-04 DIAGNOSIS — E785 Hyperlipidemia, unspecified: Secondary | ICD-10-CM

## 2016-01-04 DIAGNOSIS — R5383 Other fatigue: Secondary | ICD-10-CM

## 2016-01-04 DIAGNOSIS — Z940 Kidney transplant status: Secondary | ICD-10-CM

## 2016-01-04 NOTE — Patient Instructions (Addendum)
Your physician has requested that you have an echocardiogram @ [address] - [address]rd [address]. Echocardiography is a painless test that uses sound waves to create images of your heart. It provides your doctor with information about the size and shape of your heart and how well your heart's chambers and valves are working. This procedure takes approximately one hour. There are no restrictions for this procedure.  Your physician has requested that you have a [place] @ [place] per patient request. For further information please visit [email or web address]. Please follow instruction sheet, as given.  Your physician recommends that you schedule a follow-up appointment after your testing.

## 2016-01-08 ENCOUNTER — Encounter: Payer: Self-pay | Admitting: *Deleted

## 2016-01-08 DIAGNOSIS — Z940 Kidney transplant status: Secondary | ICD-10-CM | POA: Insufficient documentation

## 2016-01-08 DIAGNOSIS — R0602 Shortness of breath: Secondary | ICD-10-CM | POA: Insufficient documentation

## 2016-01-08 DIAGNOSIS — E785 Hyperlipidemia, unspecified: Secondary | ICD-10-CM | POA: Insufficient documentation

## 2016-01-08 DIAGNOSIS — R5383 Other fatigue: Secondary | ICD-10-CM | POA: Insufficient documentation

## 2016-01-08 DIAGNOSIS — Z8249 Family history of ischemic heart disease and other diseases of the circulatory system: Secondary | ICD-10-CM | POA: Insufficient documentation

## 2016-01-08 NOTE — Progress Notes (Signed)
OFFICE NOTE  Chief Complaint:  Dyspnea on exertion  Primary Care Physician: Kandice Hams, MD  HPI:  Megan Small is a 65 y.o. female who is referred to me for evaluation of progressive dyspnea on exertion. She has a history of polycystic kidney disease and underwent renal transplant. She is immunosuppressed on tacrolimus and Myfortic. She reports recent progressive shortness of breath with exertion. She denies any chest pain. Her [name] for nephrology and Dr. [name] do who placed a fistula. Was placed prior to her transplant apparently she has not needed dialysis. So far the transplant seems to be doing well. She had an EKG today which shows normal sinus rhythm however in poor R-wave progression noted anteriorly with a heart rate of 78. Other cardiac risk factors include dyslipidemia on Crestor and hypertension. She apparently had a dobutamine echo stress test prior to her transplant which was low risk. LV function was normal however she felt "terrible with that". Likely from the high dose of dobutamine.  PMHx:  Past Medical History  Diagnosis Date  . Chronic kidney disease   . Hypertension   . Hyperlipidemia   . History of hiatal hernia   . Blood dyscrasia     Past Surgical History  Procedure Laterality Date  . Kidney transplant    . Abdominal hysterectomy    . Shoulder surgery Left   . Pelvic prolapse    . Cystocele repair    . Rectocele repair    . Peripheral vascular catheterization N/A 09/09/2015    Procedure: A/V Shuntogram/Fistulagram;  Surgeon: Algernon Huxley, MD;  Location: [facility];  Service: Cardiovascular;  Laterality: N/A;  . Peripheral vascular catheterization N/A 09/09/2015    Procedure: A/V Shunt Intervention;  Surgeon: Algernon Huxley, MD;  Location: [facility];  Service: Cardiovascular;  Laterality: N/A;    FAMHx:  Family History  Problem Relation Age of Onset  . Cancer Mother   . Aneurysm Mother   . Heart disease Father   . Heart  attack Father   . Hyperlipidemia Father     SOCHx:   reports that she has never smoked. She has never used smokeless tobacco. She reports that she does not drink alcohol or use illicit drugs.  ALLERGIES:  Allergies  Allergen Reactions  . Lisinopril Swelling  . Tape Rash    ROS: Pertinent items noted in HPI and remainder of comprehensive ROS otherwise negative.  HOME MEDS: Current Outpatient Prescriptions  Medication Sig Dispense Refill  . acetaminophen (TYLENOL) 500 MG tablet Take 500 mg by mouth every 6 (six) hours as needed for mild pain.    Marland Kitchen aspirin 81 MG tablet Take 81 mg by mouth daily.    . calcium carbonate (TUMS EX) 750 MG chewable tablet Chew 1 tablet by mouth 2 (two) times daily.    . cetirizine (ZYRTEC) 10 MG chewable tablet Chew 10 mg by mouth daily.    . Cholecalciferol (VITAMIN D-3) 1000 units CAPS Take 2 capsules by mouth daily.    . clonazePAM (KLONOPIN) 1 MG tablet Take 1 mg by mouth 2 (two) times daily as needed for anxiety.    Marland Kitchen estradiol (ESTRACE) 0.5 MG tablet Take 0.5 mg by mouth daily.    . magnesium oxide (MAG-OX) 400 MG tablet Take 400 mg by mouth daily.    . Melatonin 5 MG CAPS Take 1 capsule by mouth daily as needed.    . Multiple Vitamins-Minerals (CENTRUM SILVER PO) Take 1 capsule  by mouth daily.    . mycophenolate (MYFORTIC) 180 MG EC tablet Take 180 mg by mouth 4 (four) times daily.    Marland Kitchen NIFEdipine (PROCARDIA XL/ADALAT-CC) 60 MG 24 hr tablet Take 60 mg by mouth daily.    Marland Kitchen omeprazole (PRILOSEC) 20 MG capsule Take 20 mg by mouth daily.    . predniSONE (DELTASONE) 5 MG tablet Take 5 mg by mouth daily with breakfast.    . rosuvastatin (CRESTOR) 5 MG tablet Take 5 mg by mouth every other day.    . Sulfamethoxazole-Trimethoprim (SULFAMETHOXAZOLE-TMP DS PO) Take 1 capsule by mouth every other day.    . tacrolimus (PROGRAF) 0.5 MG capsule Take 0.5 mg by mouth daily.    . tacrolimus (PROGRAF) 1 MG capsule Take 1 mg by mouth 2 (two) times daily.     No  current facility-administered medications for this visit.    LABS/IMAGING: No results found for this or any previous visit (from the past 48 hour(s)). No results found.  WEIGHTS: Wt Readings from Last 3 Encounters:  01/04/16 215 lb (97.523 kg)  09/09/15 214 lb (97.07 kg)    VITALS: BP 114/62 mmHg  Pulse 78  Ht 5\' 5"  (1.651 m)  Wt 215 lb (97.523 kg)  BMI 35.78 kg/m2  EXAM: General appearance: alert and no distress Neck: no carotid bruit, no JVD and thyroid not enlarged, symmetric, no tenderness/mass/nodules Lungs: clear to auscultation bilaterally Heart: regular rate and rhythm, S1, S2 normal, no murmur, click, rub or gallop Abdomen: soft, non-tender; bowel sounds normal; no masses,  no organomegaly Extremities: extremities normal, atraumatic, no cyanosis or edema and Right upper arm fistula with positive thrill Pulses: 2+ and symmetric Skin: Skin color, texture, turgor normal. No rashes or lesions Neurologic: Grossly normal Psych: Pleasant  EKG: Normal sinus rhythm at 78, poor R-wave progression anteriorly  ASSESSMENT: 1. Progressive dyspnea on exertion 2. Abnormal EKG with poor R-wave progression 3. End-stage renal disease status post renal transplant on immunosuppressive therapy 4. Hypertension 5. Dyslipidemia 6. Strong family history of coronary disease 7. Autosomal dominant polycystic kidney disease  PLAN: 1.   Megan Small has a number of cardiovascular risk factors and symptoms of progressive dyspnea on exertion which could be related to coronary artery disease. I'm concerned about her risk factors and family history of coronary disease. I recommend an echocardiogram and Lexiscan nuclear stress test. I'll review those results and plan to see her back to discuss them further in a few weeks.  Thanks again for the kind referral.  Pixie Casino, MD, [facility] Attending Cardiologist [place] 01/08/2016, 1:22 PM

## 2016-01-11 ENCOUNTER — Ambulatory Visit
Admission: RE | Admit: 2016-01-11 | Discharge: 2016-01-11 | Disposition: A | Discharge status: Home / Self Care | Payer: 59 | Source: Ambulatory Visit | Attending: Internal Medicine | Admitting: Internal Medicine

## 2016-01-11 DIAGNOSIS — E785 Hyperlipidemia, unspecified: Secondary | ICD-10-CM | POA: Diagnosis not present

## 2016-01-11 DIAGNOSIS — R0602 Shortness of breath: Secondary | ICD-10-CM | POA: Diagnosis not present

## 2016-01-11 DIAGNOSIS — Z8249 Family history of ischemic heart disease and other diseases of the circulatory system: Secondary | ICD-10-CM | POA: Diagnosis not present

## 2016-01-11 DIAGNOSIS — Z940 Kidney transplant status: Secondary | ICD-10-CM

## 2016-01-11 DIAGNOSIS — R5383 Other fatigue: Secondary | ICD-10-CM

## 2016-01-11 DIAGNOSIS — R079 Chest pain, unspecified: Secondary | ICD-10-CM | POA: Diagnosis not present

## 2016-01-11 LAB — NM MYOCAR MULTI W/SPECT W/WALL MOTION / EF
CHL CUP MPHR: 154 {beats}/min
CHL CUP RESTING HR STRESS: 70 {beats}/min
CSEPEDS: 5 s
CSEPEW: 1 METS
Exercise duration (min): 1 min
LVDIAVOL: 61 mL (ref 46–106)
LVSYSVOL: 19 mL
NUC STRESS TID: 0.81
Peak HR: 81 {beats}/min
Percent HR: 52 %
SDS: 2
SRS: 10
SSS: 8

## 2016-01-11 MED ORDER — REGADENOSON 0.4 MG/5ML IV SOLN
0.4000 mg | Freq: Once | INTRAVENOUS | Status: AC
  Administered 2016-01-11: 0.4 mg via INTRAVENOUS

## 2016-01-11 MED ORDER — TECHNETIUM TC 99M SESTAMIBI - CARDIOLITE
13.0000 | Freq: Once | INTRAVENOUS | Status: AC | PRN
  Administered 2016-01-11: 08:00:00 13.02 via INTRAVENOUS

## 2016-01-11 MED ORDER — TECHNETIUM TC 99M SESTAMIBI GENERIC - CARDIOLITE
30.0000 | Freq: Once | INTRAVENOUS | Status: AC | PRN
  Administered 2016-01-11: 30.62 via INTRAVENOUS

## 2016-01-14 ENCOUNTER — Ambulatory Visit (INDEPENDENT_AMBULATORY_CARE_PROVIDER_SITE_OTHER): Payer: 59

## 2016-01-14 ENCOUNTER — Other Ambulatory Visit: Payer: Self-pay

## 2016-01-14 DIAGNOSIS — R0602 Shortness of breath: Secondary | ICD-10-CM

## 2016-01-14 DIAGNOSIS — R5383 Other fatigue: Secondary | ICD-10-CM

## 2016-01-19 DIAGNOSIS — Z85828 Personal history of other malignant neoplasm of skin: Secondary | ICD-10-CM | POA: Diagnosis not present

## 2016-01-19 DIAGNOSIS — Q612 Polycystic kidney, adult type: Secondary | ICD-10-CM | POA: Diagnosis not present

## 2016-01-19 DIAGNOSIS — I770 Arteriovenous fistula, acquired: Secondary | ICD-10-CM | POA: Diagnosis not present

## 2016-01-19 DIAGNOSIS — I10 Essential (primary) hypertension: Secondary | ICD-10-CM | POA: Diagnosis not present

## 2016-01-19 DIAGNOSIS — R0602 Shortness of breath: Secondary | ICD-10-CM | POA: Diagnosis not present

## 2016-01-19 DIAGNOSIS — Z940 Kidney transplant status: Secondary | ICD-10-CM | POA: Diagnosis not present

## 2016-01-19 DIAGNOSIS — E785 Hyperlipidemia, unspecified: Secondary | ICD-10-CM | POA: Diagnosis not present

## 2016-01-19 DIAGNOSIS — B349 Viral infection, unspecified: Secondary | ICD-10-CM | POA: Diagnosis not present

## 2016-01-21 DIAGNOSIS — R0609 Other forms of dyspnea: Secondary | ICD-10-CM | POA: Diagnosis not present

## 2016-01-22 ENCOUNTER — Ambulatory Visit (INDEPENDENT_AMBULATORY_CARE_PROVIDER_SITE_OTHER): Payer: 59 | Admitting: Internal Medicine

## 2016-01-22 ENCOUNTER — Encounter: Payer: Self-pay | Admitting: Internal Medicine

## 2016-01-22 VITALS — BP 143/80 | HR 96 | Ht 65.0 in | Wt 213.6 lb

## 2016-01-22 DIAGNOSIS — D689 Coagulation defect, unspecified: Secondary | ICD-10-CM

## 2016-01-22 DIAGNOSIS — Z940 Kidney transplant status: Secondary | ICD-10-CM

## 2016-01-22 DIAGNOSIS — Z01818 Encounter for other preprocedural examination: Secondary | ICD-10-CM

## 2016-01-22 DIAGNOSIS — I200 Unstable angina: Secondary | ICD-10-CM | POA: Insufficient documentation

## 2016-01-22 DIAGNOSIS — R079 Chest pain, unspecified: Secondary | ICD-10-CM

## 2016-01-22 DIAGNOSIS — Z8249 Family history of ischemic heart disease and other diseases of the circulatory system: Secondary | ICD-10-CM

## 2016-01-22 DIAGNOSIS — E785 Hyperlipidemia, unspecified: Secondary | ICD-10-CM

## 2016-01-22 DIAGNOSIS — R0602 Shortness of breath: Secondary | ICD-10-CM | POA: Diagnosis not present

## 2016-01-22 NOTE — Patient Instructions (Signed)
Schedule cath with Dr Gwenlyn Found-- left femoral--Your physician has requested that you have a LEFT HEART cardiac catheterization. Cardiac catheterization is used to diagnose and/or treat various heart conditions. Doctors may recommend this procedure for a number of different reasons. The most common reason is to evaluate chest pain. Chest pain can be a symptom of coronary artery disease (CAD), and cardiac catheterization can show whether plaque is narrowing or blocking your heart's arteries. This procedure is also used to evaluate the valves, as well as measure the blood flow and oxygen levels in different parts of your heart. For further information please visit [email or web address]. Please follow instruction sheet, as given.   Need labs--BMP,PT,PTT,CBC  If you need a refill on your cardiac medications before your next appointment, please call your pharmacy.

## 2016-01-22 NOTE — Progress Notes (Signed)
OFFICE NOTE  Chief Complaint:  Chest pain, progressive dyspnea, follow-up studies  Primary Care Physician: Kandice Hams, MD  HPI:  Megan Small is a 65 y.o. female who is referred to me for evaluation of progressive dyspnea on exertion. She has a history of polycystic kidney disease and underwent renal transplant. She is immunosuppressed on tacrolimus and Myfortic. She reports recent progressive shortness of breath with exertion. She denies any chest pain. Her [name] for nephrology and Dr. [name] do who placed a fistula. Was placed prior to her transplant apparently she has not needed dialysis. So far the transplant seems to be doing well. She had an EKG today which shows normal sinus rhythm however in poor R-wave progression noted anteriorly with a heart rate of 78. Other cardiac risk factors include dyslipidemia on Crestor and hypertension. She apparently had a dobutamine echo stress test prior to her transplant which was low risk. LV function was normal however she felt "terrible with that". Likely from the high dose of dobutamine.  01/22/2016  Debar returns today for follow-up. She underwent echocardiography as well as a nuclear stress test. The echo demonstrated a normal LVEF of 123456, mild diastolic dysfunction and trivial tricuspid regurgitation without significant pulmonary hypertension. Her nuclear stress test was low risk and did not demonstrate any reversible ischemia with normal LV function. I discussed the findings with her today and she reports that she continues to have significant symptoms and worsening dyspnea on exertion, now reporting some chest discomfort particularly when doing certain activities. She discussed these findings with her primary care provider and her nephrologist, who both feel that they could be signs of coronary ischemia. I discussed with her there are some possible limitations of nuclear stress testing in a very low risk of false negative, however  it is possible that the test could underestimate her degree of coronary disease.  PMHx:  Past Medical History  Diagnosis Date  . Chronic kidney disease   . Hypertension   . Hyperlipidemia   . History of hiatal hernia   . Blood dyscrasia     Past Surgical History  Procedure Laterality Date  . Kidney transplant    . Abdominal hysterectomy    . Shoulder surgery Left   . Pelvic prolapse    . Cystocele repair    . Rectocele repair    . Peripheral vascular catheterization N/A 09/09/2015    Procedure: A/V Shuntogram/Fistulagram;  Surgeon: Algernon Huxley, MD;  Location: [facility];  Service: Cardiovascular;  Laterality: N/A;  . Peripheral vascular catheterization N/A 09/09/2015    Procedure: A/V Shunt Intervention;  Surgeon: Algernon Huxley, MD;  Location: [facility];  Service: Cardiovascular;  Laterality: N/A;    FAMHx:  Family History  Problem Relation Age of Onset  . Pancreatic cancer Mother   . Aneurysm Mother     brain   . Heart disease Father   . Heart attack Father   . Hyperlipidemia Father   . Polycystic kidney disease Father   . Leukemia Father   . Stroke Maternal Grandmother   . Heart attack Paternal Grandmother   . Polycystic kidney disease Paternal Grandfather   . Pancreatic cancer Brother   . Polycystic kidney disease      SOCHx:   reports that she has never smoked. She has never used smokeless tobacco. She reports that she does not drink alcohol or use illicit drugs.  ALLERGIES:  Allergies  Allergen Reactions  . Lisinopril Swelling  .  Tape Rash    ROS: Pertinent items noted in HPI and remainder of comprehensive ROS otherwise negative.  HOME MEDS: Current Outpatient Prescriptions  Medication Sig Dispense Refill  . acetaminophen (TYLENOL) 500 MG tablet Take 500 mg by mouth every 6 (six) hours as needed for mild pain.    Marland Kitchen aspirin 81 MG tablet Take 81 mg by mouth daily.    . calcium carbonate (TUMS EX) 750 MG chewable tablet Chew 1 tablet by  mouth 2 (two) times daily.    . cetirizine (ZYRTEC) 10 MG chewable tablet Chew 10 mg by mouth daily.    . Cholecalciferol (VITAMIN D-3) 1000 units CAPS Take 2 capsules by mouth daily.    . clonazePAM (KLONOPIN) 1 MG tablet Take 1 mg by mouth 2 (two) times daily as needed for anxiety.    Marland Kitchen estradiol (ESTRACE) 0.5 MG tablet Take 0.5 mg by mouth daily.    . magnesium oxide (MAG-OX) 400 MG tablet Take 400 mg by mouth daily.    . Melatonin 5 MG CAPS Take 1 capsule by mouth daily as needed.    . Multiple Vitamins-Minerals (CENTRUM SILVER PO) Take 1 capsule by mouth daily.    . mycophenolate (MYFORTIC) 180 MG EC tablet Take 180 mg by mouth 4 (four) times daily.    Marland Kitchen NIFEdipine (PROCARDIA XL/ADALAT-CC) 60 MG 24 hr tablet Take 60 mg by mouth daily.    Marland Kitchen omeprazole (PRILOSEC) 20 MG capsule Take 20 mg by mouth daily.    . predniSONE (DELTASONE) 5 MG tablet Take 5 mg by mouth daily with breakfast.    . rosuvastatin (CRESTOR) 5 MG tablet Take 5 mg by mouth daily at 6 PM.     . Sulfamethoxazole-Trimethoprim (SULFAMETHOXAZOLE-TMP DS PO) Take 1 capsule by mouth every other day.    . tacrolimus (PROGRAF) 0.5 MG capsule Take 0.5 mg by mouth daily.    . tacrolimus (PROGRAF) 1 MG capsule Take 1 mg by mouth 2 (two) times daily.     No current facility-administered medications for this visit.    LABS/IMAGING: No results found for this or any previous visit (from the past 48 hour(s)). No results found.  WEIGHTS: Wt Readings from Last 3 Encounters:  01/22/16 213 lb 9.6 oz (96.888 kg)  01/04/16 215 lb (97.523 kg)  09/09/15 214 lb (97.07 kg)    VITALS: BP 143/80 mmHg  Pulse 96  Ht 5\' 5"  (1.651 m)  Wt 213 lb 9.6 oz (96.888 kg)  BMI 35.54 kg/m2  EXAM: Deferred  EKG: Deferred  ASSESSMENT: 1. Progressive dyspnea on exertion - low risk Myoview and echo demonstrating normal LV EF 60-65% 2. Abnormal EKG with poor R-wave progression 3. End-stage renal disease status post renal transplant on  immunosuppressive therapy 4. Hypertension 5. Dyslipidemia 6. Strong family history of coronary disease 7. Autosomal dominant polycystic kidney disease 8. Unstable angina  PLAN: 1.   Mrs. Sharman Cheek had a low risk stress test with normal LV function on both echo and with nuclear gating. Despite that she's had progressive dyspnea on exertion and now chest discomfort with worsening fatigue. She's had significant exercise intolerance which is clinically concerning for unstable angina. She is very concerned about coronary artery disease and as there is a small possibility her testing may underestimate this, it is reasonable to consider heart catheterization. She had a already discussed this with her nephrologist who felt that while there is a risk to her transplanted kidney that risk hopefully could be minimized with judicious dye use. Since  she has failed fistulas in the left arm and a fistula in the right upper extremity, she would need femoral access for cardiac catheterization. With her transplanted kidney coming off of the great arteries in the right groin area, the best approach may be through the left femoral artery. She wishes to have this as soon as possible however my schedule will not dictate this in the next few weeks. She is familiar with Dr. Gwenlyn Found and asked if he could do the procedure for her. We will try to arrange that as soon as possible.  Thanks again for the kind referral. I will keep you updated of to the results of her cardiac catheterization.  Pixie Casino, MD, [facility] Attending Cardiologist [place] 01/22/2016, 6:15 PM

## 2016-01-26 ENCOUNTER — Other Ambulatory Visit: Payer: Self-pay | Admitting: *Deleted

## 2016-01-26 DIAGNOSIS — E785 Hyperlipidemia, unspecified: Secondary | ICD-10-CM | POA: Diagnosis not present

## 2016-01-26 DIAGNOSIS — T82858A Stenosis of vascular prosthetic devices, implants and grafts, initial encounter: Secondary | ICD-10-CM | POA: Diagnosis not present

## 2016-01-26 DIAGNOSIS — Y841 Kidney dialysis as the cause of abnormal reaction of the patient, or of later complication, without mention of misadventure at the time of the procedure: Secondary | ICD-10-CM | POA: Diagnosis not present

## 2016-01-26 DIAGNOSIS — R5383 Other fatigue: Secondary | ICD-10-CM

## 2016-01-26 DIAGNOSIS — N186 End stage renal disease: Secondary | ICD-10-CM | POA: Diagnosis not present

## 2016-01-26 DIAGNOSIS — R079 Chest pain, unspecified: Secondary | ICD-10-CM

## 2016-01-26 DIAGNOSIS — I10 Essential (primary) hypertension: Secondary | ICD-10-CM | POA: Diagnosis not present

## 2016-01-26 DIAGNOSIS — Z992 Dependence on renal dialysis: Secondary | ICD-10-CM | POA: Diagnosis not present

## 2016-01-26 DIAGNOSIS — R0609 Other forms of dyspnea: Principal | ICD-10-CM

## 2016-01-26 DIAGNOSIS — T82318A Breakdown (mechanical) of other vascular grafts, initial encounter: Secondary | ICD-10-CM | POA: Diagnosis not present

## 2016-02-05 DIAGNOSIS — R0602 Shortness of breath: Secondary | ICD-10-CM | POA: Diagnosis not present

## 2016-02-05 DIAGNOSIS — Z01818 Encounter for other preprocedural examination: Secondary | ICD-10-CM | POA: Diagnosis not present

## 2016-02-05 DIAGNOSIS — D689 Coagulation defect, unspecified: Secondary | ICD-10-CM | POA: Diagnosis not present

## 2016-02-05 DIAGNOSIS — Z940 Kidney transplant status: Secondary | ICD-10-CM | POA: Diagnosis not present

## 2016-02-05 DIAGNOSIS — R079 Chest pain, unspecified: Secondary | ICD-10-CM | POA: Diagnosis not present

## 2016-02-09 LAB — BASIC METABOLIC PANEL
BUN/Creatinine Ratio: 19 (ref 12–28)
BUN: 22 mg/dL (ref 8–27)
CALCIUM: 9.5 mg/dL (ref 8.7–10.3)
CO2: 20 mmol/L (ref 18–29)
CREATININE: 1.18 mg/dL — AB (ref 0.57–1.00)
Chloride: 101 mmol/L (ref 96–106)
GFR calc Af Amer: 56 mL/min/{1.73_m2} — ABNORMAL LOW (ref >59)
GFR calc non Af Amer: 48 mL/min/{1.73_m2} — ABNORMAL LOW (ref >59)
GLUCOSE: 93 mg/dL (ref 65–99)
Potassium: 4.3 mmol/L (ref 3.5–5.2)
Sodium: 140 mmol/L (ref 134–144)

## 2016-02-09 LAB — PROTIME-INR
INR: 0.9 (ref 0.8–1.2)
Prothrombin Time: 9.6 s (ref 9.1–12.0)

## 2016-02-09 LAB — CBC

## 2016-02-09 LAB — APTT: APTT: 26 s (ref 24–33)

## 2016-02-09 LAB — TSH: TSH: 5.06 u[IU]/mL — AB (ref 0.450–4.500)

## 2016-02-11 ENCOUNTER — Encounter (HOSPITAL_COMMUNITY)
Admission: RE | Disposition: A | Discharge status: Home / Self Care | Payer: Self-pay | Source: Ambulatory Visit | Attending: Cardiovascular Disease

## 2016-02-11 ENCOUNTER — Ambulatory Visit (HOSPITAL_COMMUNITY)
Admission: RE | Admit: 2016-02-11 | Discharge: 2016-02-11 | Disposition: A | Discharge status: Home / Self Care | Payer: 59 | Source: Ambulatory Visit | Attending: Cardiovascular Disease | Admitting: Cardiovascular Disease

## 2016-02-11 DIAGNOSIS — E785 Hyperlipidemia, unspecified: Secondary | ICD-10-CM | POA: Diagnosis not present

## 2016-02-11 DIAGNOSIS — I120 Hypertensive chronic kidney disease with stage 5 chronic kidney disease or end stage renal disease: Secondary | ICD-10-CM | POA: Diagnosis not present

## 2016-02-11 DIAGNOSIS — Z8249 Family history of ischemic heart disease and other diseases of the circulatory system: Secondary | ICD-10-CM | POA: Insufficient documentation

## 2016-02-11 DIAGNOSIS — Z6835 Body mass index (BMI) 35.0-35.9, adult: Secondary | ICD-10-CM | POA: Insufficient documentation

## 2016-02-11 DIAGNOSIS — Q612 Polycystic kidney, adult type: Secondary | ICD-10-CM | POA: Insufficient documentation

## 2016-02-11 DIAGNOSIS — R079 Chest pain, unspecified: Secondary | ICD-10-CM | POA: Diagnosis not present

## 2016-02-11 DIAGNOSIS — I200 Unstable angina: Secondary | ICD-10-CM | POA: Diagnosis not present

## 2016-02-11 DIAGNOSIS — E663 Overweight: Secondary | ICD-10-CM | POA: Insufficient documentation

## 2016-02-11 DIAGNOSIS — Z940 Kidney transplant status: Secondary | ICD-10-CM | POA: Diagnosis not present

## 2016-02-11 DIAGNOSIS — R0609 Other forms of dyspnea: Secondary | ICD-10-CM | POA: Insufficient documentation

## 2016-02-11 DIAGNOSIS — R5383 Other fatigue: Secondary | ICD-10-CM

## 2016-02-11 DIAGNOSIS — R9431 Abnormal electrocardiogram [ECG] [EKG]: Secondary | ICD-10-CM | POA: Diagnosis not present

## 2016-02-11 DIAGNOSIS — N186 End stage renal disease: Secondary | ICD-10-CM | POA: Diagnosis not present

## 2016-02-11 DIAGNOSIS — Z7982 Long term (current) use of aspirin: Secondary | ICD-10-CM | POA: Diagnosis not present

## 2016-02-11 HISTORY — PX: CARDIAC CATHETERIZATION: SHX172

## 2016-02-11 LAB — CBC
HCT: 39 % (ref 36.0–46.0)
HEMOGLOBIN: 12.9 g/dL (ref 12.0–15.0)
MCH: 27.9 pg (ref 26.0–34.0)
MCHC: 33.1 g/dL (ref 30.0–36.0)
MCV: 84.4 fL (ref 78.0–100.0)
Platelets: 241 10*3/uL (ref 150–400)
RBC: 4.62 MIL/uL (ref 3.87–5.11)
RDW: 14.3 % (ref 11.5–15.5)
WBC: 8.5 10*3/uL (ref 4.0–10.5)

## 2016-02-11 SURGERY — LEFT HEART CATH AND CORONARY ANGIOGRAPHY

## 2016-02-11 MED ORDER — HEPARIN (PORCINE) IN NACL 2-0.9 UNIT/ML-% IJ SOLN
INTRAMUSCULAR | Status: AC
  Filled 2016-02-11: qty 1000

## 2016-02-11 MED ORDER — MORPHINE SULFATE (PF) 2 MG/ML IV SOLN: 2.0000 mg | INTRAVENOUS | Status: DC | PRN

## 2016-02-11 MED ORDER — SODIUM CHLORIDE 0.9% FLUSH: 3.0000 mL | Freq: Two times a day (BID) | INTRAVENOUS | Status: DC

## 2016-02-11 MED ORDER — FENTANYL CITRATE (PF) 100 MCG/2ML IJ SOLN
INTRAMUSCULAR | Status: DC | PRN
  Administered 2016-02-11: 25 ug via INTRAVENOUS

## 2016-02-11 MED ORDER — MIDAZOLAM HCL 2 MG/2ML IJ SOLN
INTRAMUSCULAR | Status: AC
  Filled 2016-02-11: qty 2

## 2016-02-11 MED ORDER — FENTANYL CITRATE (PF) 100 MCG/2ML IJ SOLN
INTRAMUSCULAR | Status: AC
  Filled 2016-02-11: qty 2

## 2016-02-11 MED ORDER — IOPAMIDOL (ISOVUE-370) INJECTION 76%
INTRAVENOUS | Status: AC
  Filled 2016-02-11: qty 50

## 2016-02-11 MED ORDER — ONDANSETRON HCL 4 MG/2ML IJ SOLN: 4.0000 mg | Freq: Four times a day (QID) | INTRAMUSCULAR | Status: DC | PRN

## 2016-02-11 MED ORDER — LIDOCAINE HCL (PF) 1 % IJ SOLN
INTRAMUSCULAR | Status: AC
  Filled 2016-02-11: qty 30

## 2016-02-11 MED ORDER — MIDAZOLAM HCL 2 MG/2ML IJ SOLN
INTRAMUSCULAR | Status: DC | PRN
  Administered 2016-02-11: 1 mg via INTRAVENOUS

## 2016-02-11 MED ORDER — LIDOCAINE HCL (PF) 1 % IJ SOLN
INTRAMUSCULAR | Status: DC | PRN
  Administered 2016-02-11: 20 mL via SUBCUTANEOUS

## 2016-02-11 MED ORDER — SODIUM CHLORIDE 0.9 % IV SOLN
INTRAVENOUS | Status: DC
  Administered 2016-02-11: 08:00:00 via INTRAVENOUS

## 2016-02-11 MED ORDER — ASPIRIN 81 MG PO CHEW
81.0000 mg | CHEWABLE_TABLET | Freq: Every day | ORAL | Status: DC
Start: 2016-02-12 — End: 2016-02-11

## 2016-02-11 MED ORDER — SODIUM CHLORIDE 0.9 % WEIGHT BASED INFUSION: 3.0000 mL/kg/h | INTRAVENOUS | Status: AC

## 2016-02-11 MED ORDER — SODIUM CHLORIDE 0.9% FLUSH: 3.0000 mL | INTRAVENOUS | Status: DC | PRN

## 2016-02-11 MED ORDER — HEPARIN (PORCINE) IN NACL 2-0.9 UNIT/ML-% IJ SOLN
INTRAMUSCULAR | Status: DC | PRN
  Administered 2016-02-11: 1500 mL

## 2016-02-11 MED ORDER — ACETAMINOPHEN 325 MG PO TABS: 650.0000 mg | ORAL_TABLET | ORAL | Status: DC | PRN

## 2016-02-11 MED ORDER — IOPAMIDOL (ISOVUE-370) INJECTION 76%
INTRAVENOUS | Status: DC | PRN
  Administered 2016-02-11: 38 mL

## 2016-02-11 MED ORDER — ASPIRIN 81 MG PO CHEW: 81.0000 mg | CHEWABLE_TABLET | ORAL | Status: DC

## 2016-02-11 MED ORDER — SODIUM CHLORIDE 0.9 % IV SOLN: 250.0000 mL | INTRAVENOUS | Status: DC | PRN

## 2016-02-11 SURGICAL SUPPLY — 6 items
CATH INFINITI 5FR MULTPACK ANG (CATHETERS) ×2 IMPLANT
KIT HEART LEFT (KITS) ×3 IMPLANT
PACK CARDIAC CATHETERIZATION (CUSTOM PROCEDURE TRAY) ×3 IMPLANT
SHEATH PINNACLE 5F 10CM (SHEATH) ×2 IMPLANT
TRANSDUCER W/STOPCOCK (MISCELLANEOUS) ×3 IMPLANT
WIRE EMERALD 3MM-J .035X150CM (WIRE) ×2 IMPLANT

## 2016-02-11 NOTE — Interval H&P Note (Signed)
Cath Lab Visit (complete for each Cath Lab visit)  Clinical Evaluation Leading to the Procedure:   ACS: No.  Non-ACS:    Anginal Classification: CCS II  Anti-ischemic medical therapy: No Therapy  Non-Invasive Test Results: Low-risk stress test findings: cardiac mortality <1%/year  Prior CABG: No previous CABG      History and Physical Interval Note:  02/11/2016 9:27 AM  Megan Small  has presented today for surgery, with the diagnosis of fatigue  The various methods of treatment have been discussed with the patient and family. After consideration of risks, benefits and other options for treatment, the patient has consented to  Procedure(s): Left Heart Cath and Coronary Angiography (N/A) as a surgical intervention .  The patient's history has been reviewed, patient examined, no change in status, stable for surgery.  I have reviewed the patient's chart and labs.  Questions were answered to the patient's satisfaction.     Quay Burow

## 2016-02-11 NOTE — Discharge Instructions (Signed)
Angiogram, Care After °Refer to this sheet in the next few weeks. These instructions provide you with information about caring for yourself after your procedure. Your health care provider may also give you more specific instructions. Your treatment has been planned according to current medical practices, but problems sometimes occur. Call your health care provider if you have any problems or questions after your procedure. °WHAT TO EXPECT AFTER THE PROCEDURE °After your procedure, it is typical to have the following: °· Bruising at the catheter insertion site that usually fades within 1-2 weeks. °· Blood collecting in the tissue (hematoma) that may be painful to the touch. It should usually decrease in size and tenderness within 1-2 weeks. °HOME CARE INSTRUCTIONS °· Take medicines only as directed by your health care provider. °· You may shower 24-48 hours after the procedure or as directed by your health care provider. Remove the bandage (dressing) and gently wash the site with plain soap and water. Pat the area dry with a clean towel. Do not rub the site, because this may cause bleeding. °· Do not take baths, swim, or use a hot tub until your health care provider approves. °· Check your insertion site every day for redness, swelling, or drainage. °· Do not apply powder or lotion to the site. °· Do not lift over 10 lb (4.5 kg) for 5 days after your procedure or as directed by your health care provider. °· Ask your health care provider when it is okay to: °¨ Return to work or school. °¨ Resume usual physical activities or sports. °¨ Resume sexual activity. °· Do not drive home if you are discharged the same day as the procedure. Have someone else drive you. °· You may drive 24 hours after the procedure unless otherwise instructed by your health care provider. °· Do not operate machinery or power tools for 24 hours after the procedure or as directed by your health care provider. °· If your procedure was done as an  outpatient procedure, which means that you went home the same day as your procedure, a responsible adult should be with you for the first 24 hours after you arrive home. °· Keep all follow-up visits as directed by your health care provider. This is important. °SEEK MEDICAL CARE IF: °· You have a fever. °· You have chills. °· You have increased bleeding from the catheter insertion site. Hold pressure on the site.  CALL 911 °SEEK IMMEDIATE MEDICAL CARE IF: °· You have unusual pain at the catheter insertion site. °· You have redness, warmth, or swelling at the catheter insertion site. °· You have drainage (other than a small amount of blood on the dressing) from the catheter insertion site. °· The catheter insertion site is bleeding, and the bleeding does not stop after 30 minutes of holding steady pressure on the site. °· The area near or just beyond the catheter insertion site becomes pale, cool, tingly, or numb. °  °This information is not intended to replace advice given to you by your health care provider. Make sure you discuss any questions you have with your health care provider. °  °Document Released: 03/10/2005 Document Revised: 09/12/2014 Document Reviewed: 01/23/2013 °[employer]vier Interactive Patient Education ©2016 [employer] ° °

## 2016-02-11 NOTE — H&P (View-Only) (Signed)
OFFICE NOTE  Chief Complaint:  Chest pain, progressive dyspnea, follow-up studies  Primary Care Physician: Kandice Hams, MD  HPI:  Megan Small is a 65 y.o. female who is referred to me for evaluation of progressive dyspnea on exertion. She has a history of polycystic kidney disease and underwent renal transplant. She is immunosuppressed on tacrolimus and Myfortic. She reports recent progressive shortness of breath with exertion. She denies any chest pain. Her [name] for nephrology and Dr. [name] do who placed a fistula. Was placed prior to her transplant apparently she has not needed dialysis. So far the transplant seems to be doing well. She had an EKG today which shows normal sinus rhythm however in poor R-wave progression noted anteriorly with a heart rate of 78. Other cardiac risk factors include dyslipidemia on Crestor and hypertension. She apparently had a dobutamine echo stress test prior to her transplant which was low risk. LV function was normal however she felt "terrible with that". Likely from the high dose of dobutamine.  01/22/2016  Nicaela returns today for follow-up. She underwent echocardiography as well as a nuclear stress test. The echo demonstrated a normal LVEF of 123456, mild diastolic dysfunction and trivial tricuspid regurgitation without significant pulmonary hypertension. Her nuclear stress test was low risk and did not demonstrate any reversible ischemia with normal LV function. I discussed the findings with her today and she reports that she continues to have significant symptoms and worsening dyspnea on exertion, now reporting some chest discomfort particularly when doing certain activities. She discussed these findings with her primary care provider and her nephrologist, who both feel that they could be signs of coronary ischemia. I discussed with her there are some possible limitations of nuclear stress testing in a very low risk of false negative, however  it is possible that the test could underestimate her degree of coronary disease.  PMHx:  Past Medical History  Diagnosis Date  . Chronic kidney disease   . Hypertension   . Hyperlipidemia   . History of hiatal hernia   . Blood dyscrasia     Past Surgical History  Procedure Laterality Date  . Kidney transplant    . Abdominal hysterectomy    . Shoulder surgery Left   . Pelvic prolapse    . Cystocele repair    . Rectocele repair    . Peripheral vascular catheterization N/A 09/09/2015    Procedure: A/V Shuntogram/Fistulagram;  Surgeon: Algernon Huxley, MD;  Location: [facility];  Service: Cardiovascular;  Laterality: N/A;  . Peripheral vascular catheterization N/A 09/09/2015    Procedure: A/V Shunt Intervention;  Surgeon: Algernon Huxley, MD;  Location: [facility];  Service: Cardiovascular;  Laterality: N/A;    FAMHx:  Family History  Problem Relation Age of Onset  . Pancreatic cancer Mother   . Aneurysm Mother     brain   . Heart disease Father   . Heart attack Father   . Hyperlipidemia Father   . Polycystic kidney disease Father   . Leukemia Father   . Stroke Maternal Grandmother   . Heart attack Paternal Grandmother   . Polycystic kidney disease Paternal Grandfather   . Pancreatic cancer Brother   . Polycystic kidney disease      SOCHx:   reports that she has never smoked. She has never used smokeless tobacco. She reports that she does not drink alcohol or use illicit drugs.  ALLERGIES:  Allergies  Allergen Reactions  . Lisinopril Swelling  .  Tape Rash    ROS: Pertinent items noted in HPI and remainder of comprehensive ROS otherwise negative.  HOME MEDS: Current Outpatient Prescriptions  Medication Sig Dispense Refill  . acetaminophen (TYLENOL) 500 MG tablet Take 500 mg by mouth every 6 (six) hours as needed for mild pain.    Marland Kitchen aspirin 81 MG tablet Take 81 mg by mouth daily.    . calcium carbonate (TUMS EX) 750 MG chewable tablet Chew 1 tablet by  mouth 2 (two) times daily.    . cetirizine (ZYRTEC) 10 MG chewable tablet Chew 10 mg by mouth daily.    . Cholecalciferol (VITAMIN D-3) 1000 units CAPS Take 2 capsules by mouth daily.    . clonazePAM (KLONOPIN) 1 MG tablet Take 1 mg by mouth 2 (two) times daily as needed for anxiety.    Marland Kitchen estradiol (ESTRACE) 0.5 MG tablet Take 0.5 mg by mouth daily.    . magnesium oxide (MAG-OX) 400 MG tablet Take 400 mg by mouth daily.    . Melatonin 5 MG CAPS Take 1 capsule by mouth daily as needed.    . Multiple Vitamins-Minerals (CENTRUM SILVER PO) Take 1 capsule by mouth daily.    . mycophenolate (MYFORTIC) 180 MG EC tablet Take 180 mg by mouth 4 (four) times daily.    Marland Kitchen NIFEdipine (PROCARDIA XL/ADALAT-CC) 60 MG 24 hr tablet Take 60 mg by mouth daily.    Marland Kitchen omeprazole (PRILOSEC) 20 MG capsule Take 20 mg by mouth daily.    . predniSONE (DELTASONE) 5 MG tablet Take 5 mg by mouth daily with breakfast.    . rosuvastatin (CRESTOR) 5 MG tablet Take 5 mg by mouth daily at 6 PM.     . Sulfamethoxazole-Trimethoprim (SULFAMETHOXAZOLE-TMP DS PO) Take 1 capsule by mouth every other day.    . tacrolimus (PROGRAF) 0.5 MG capsule Take 0.5 mg by mouth daily.    . tacrolimus (PROGRAF) 1 MG capsule Take 1 mg by mouth 2 (two) times daily.     No current facility-administered medications for this visit.    LABS/IMAGING: No results found for this or any previous visit (from the past 48 hour(s)). No results found.  WEIGHTS: Wt Readings from Last 3 Encounters:  01/22/16 213 lb 9.6 oz (96.888 kg)  01/04/16 215 lb (97.523 kg)  09/09/15 214 lb (97.07 kg)    VITALS: BP 143/80 mmHg  Pulse 96  Ht 5\' 5"  (1.651 m)  Wt 213 lb 9.6 oz (96.888 kg)  BMI 35.54 kg/m2  EXAM: Deferred  EKG: Deferred  ASSESSMENT: 1. Progressive dyspnea on exertion - low risk Myoview and echo demonstrating normal LV EF 60-65% 2. Abnormal EKG with poor R-wave progression 3. End-stage renal disease status post renal transplant on  immunosuppressive therapy 4. Hypertension 5. Dyslipidemia 6. Strong family history of coronary disease 7. Autosomal dominant polycystic kidney disease 8. Unstable angina  PLAN: 1.   Mrs. Sharman Cheek had a low risk stress test with normal LV function on both echo and with nuclear gating. Despite that she's had progressive dyspnea on exertion and now chest discomfort with worsening fatigue. She's had significant exercise intolerance which is clinically concerning for unstable angina. She is very concerned about coronary artery disease and as there is a small possibility her testing may underestimate this, it is reasonable to consider heart catheterization. She had a already discussed this with her nephrologist who felt that while there is a risk to her transplanted kidney that risk hopefully could be minimized with judicious dye use. Since  she has failed fistulas in the left arm and a fistula in the right upper extremity, she would need femoral access for cardiac catheterization. With her transplanted kidney coming off of the great arteries in the right groin area, the best approach may be through the left femoral artery. She wishes to have this as soon as possible however my schedule will not dictate this in the next few weeks. She is familiar with Dr. Gwenlyn Found and asked if he could do the procedure for her. We will try to arrange that as soon as possible.  Thanks again for the kind referral. I will keep you updated of to the results of her cardiac catheterization.  Pixie Casino, MD, [facility] Attending Cardiologist Gotham C Hilty 01/22/2016, 6:15 PM

## 2016-02-11 NOTE — Progress Notes (Signed)
Site area: lt groin Site Prior to Removal:  Level 0 Pressure Applied For:  20 minutes Manual:   yes Patient Status During Pull:  stable Post Pull Site:  Level  0 Post Pull Instructions Given:  yes Post Pull Pulses Present: yes Dressing Applied:  tegaderm Bedrest begins @  [id number] Comments:

## 2016-02-11 NOTE — Research (Signed)
[facility] Study Informed Consent   Subject Name: Megan Small  Subject met inclusion and exclusion criteria.  The informed consent form, study requirements and expectations were reviewed with the subject and questions and concerns were addressed prior to the signing of the consent form.  The subject verbalized understanding of the trial requirements.  The subject agreed to participate in the trial and signed the informed consent.  The informed consent was obtained prior to performance of any protocol-specific procedures for the subject.  A copy of the signed informed consent was given to the subject and a copy was placed in the subject's medical record.  Blossom Hoops 02/11/2016, 9:49 AM

## 2016-02-11 NOTE — Research (Signed)
[facility] Research Study Informed Consent   Subject Name: Megan Small  Subject met inclusion and exclusion criteria.  The informed consent form, study requirements and expectations were reviewed with the subject and questions and concerns were addressed prior to the signing of the consent form.  The subject verbalized understanding of the trial requirements.  The subject agreed to participate in the trial and signed the informed consent.  The informed consent was obtained prior to performance of any protocol-specific procedures for the subject.  A copy of the signed informed consent was given to the subject and a copy was placed in the subject's medical record.  Blossom Hoops 02/11/2016, 9:43 AM

## 2016-02-12 ENCOUNTER — Ambulatory Visit: Payer: 59 | Admitting: Cardiology

## 2016-02-12 ENCOUNTER — Encounter (HOSPITAL_COMMUNITY): Payer: Self-pay | Admitting: Cardiovascular Disease

## 2016-02-18 ENCOUNTER — Encounter: Payer: Self-pay | Admitting: Internal Medicine

## 2016-02-18 ENCOUNTER — Ambulatory Visit (INDEPENDENT_AMBULATORY_CARE_PROVIDER_SITE_OTHER): Payer: 59 | Admitting: Internal Medicine

## 2016-02-18 VITALS — BP 139/71 | HR 92 | Ht 65.0 in | Wt 214.6 lb

## 2016-02-18 DIAGNOSIS — Z940 Kidney transplant status: Secondary | ICD-10-CM

## 2016-02-18 DIAGNOSIS — R5383 Other fatigue: Secondary | ICD-10-CM | POA: Diagnosis not present

## 2016-02-18 DIAGNOSIS — I200 Unstable angina: Secondary | ICD-10-CM

## 2016-02-18 DIAGNOSIS — R0602 Shortness of breath: Secondary | ICD-10-CM | POA: Diagnosis not present

## 2016-02-18 NOTE — Patient Instructions (Signed)
Your physician has recommended that you have a pulmonary function test @ [facility]. Pulmonary Function Tests are a group of tests that measure how well air moves in and out of your lungs.  Your physician recommends that you schedule a follow-up appointment as needed with Dr. Debara Pickett.

## 2016-02-18 NOTE — Progress Notes (Signed)
OFFICE NOTE  Chief Complaint:  Chest pain, progressive dyspnea, follow-up studies  Primary Care Physician: Kandice Hams, MD  HPI:  Megan Small is a 65 y.o. female who is referred to me for evaluation of progressive dyspnea on exertion. She has a history of polycystic kidney disease and underwent renal transplant. She is immunosuppressed on tacrolimus and Myfortic. She reports recent progressive shortness of breath with exertion. She denies any chest pain. Her [name] for nephrology and Dr. [name] do who placed a fistula. Was placed prior to her transplant apparently she has not needed dialysis. So far the transplant seems to be doing well. She had an EKG today which shows normal sinus rhythm however in poor R-wave progression noted anteriorly with a heart rate of 78. Other cardiac risk factors include dyslipidemia on Crestor and hypertension. She apparently had a dobutamine echo stress test prior to her transplant which was low risk. LV function was normal however she felt "terrible with that". Likely from the high dose of dobutamine.  01/22/2016  Megan Small returns today for follow-up. She underwent echocardiography as well as a nuclear stress test. The echo demonstrated a normal LVEF of 123456, mild diastolic dysfunction and trivial tricuspid regurgitation without significant pulmonary hypertension. Her nuclear stress test was low risk and did not demonstrate any reversible ischemia with normal LV function. I discussed the findings with her today and she reports that she continues to have significant symptoms and worsening dyspnea on exertion, now reporting some chest discomfort particularly when doing certain activities. She discussed these findings with her primary care provider and her nephrologist, who both feel that they could be signs of coronary ischemia. I discussed with her there are some possible limitations of nuclear stress testing in a very low risk of false negative, however  it is possible that the test could underestimate her degree of coronary disease.  02/18/2016  Megan Small was seen back today in the office for follow-up of her left heart catheterization. Fortunately this showed no obstructive coronary artery disease. Her echocardiogram also demonstrated normal systolic function with mild diastolic dysfunction but no clear etiology of her progressive dyspnea. While she is relieved to find that her heart is in good shape, she is concerned about worsening shortness of breath. This could also represent a pulmonary problem.  PMHx:  Past Medical History  Diagnosis Date  . Chronic kidney disease   . Hypertension   . Hyperlipidemia   . History of hiatal hernia   . Blood dyscrasia     Past Surgical History  Procedure Laterality Date  . Kidney transplant    . Abdominal hysterectomy    . Shoulder surgery Left   . Pelvic prolapse    . Cystocele repair    . Rectocele repair    . Peripheral vascular catheterization N/A 09/09/2015    Procedure: A/V Shuntogram/Fistulagram;  Surgeon: Algernon Huxley, MD;  Location: [facility];  Service: Cardiovascular;  Laterality: N/A;  . Peripheral vascular catheterization N/A 09/09/2015    Procedure: A/V Shunt Intervention;  Surgeon: Algernon Huxley, MD;  Location: [facility];  Service: Cardiovascular;  Laterality: N/A;  . Cardiac catheterization N/A 02/11/2016    Procedure: Left Heart Cath and Coronary Angiography;  Surgeon: Lorretta Harp, MD;  Location: [facility];  Service: Cardiovascular;  Laterality: N/A;    FAMHx:  Family History  Problem Relation Age of Onset  . Pancreatic cancer Mother   . Aneurysm Mother  brain   . Heart disease Father   . Heart attack Father   . Hyperlipidemia Father   . Polycystic kidney disease Father   . Leukemia Father   . Stroke Maternal Grandmother   . Heart attack Paternal Grandmother   . Polycystic kidney disease Paternal Grandfather   . Pancreatic cancer Brother   .  Polycystic kidney disease      SOCHx:   reports that she has never smoked. She has never used smokeless tobacco. She reports that she does not drink alcohol or use illicit drugs.  ALLERGIES:  Allergies  Allergen Reactions  . Lisinopril Swelling  . Tape Rash    Ok with Paper Tape  Plastic tape tears skin    ROS: Pertinent items noted in HPI and remainder of comprehensive ROS otherwise negative.  HOME MEDS: Current Outpatient Prescriptions  Medication Sig Dispense Refill  . acetaminophen (TYLENOL) 500 MG tablet Take 500 mg by mouth every 6 (six) hours as needed for mild pain.    Marland Kitchen aspirin 81 MG tablet Take 81 mg by mouth daily.    . calcium carbonate (TUMS EX) 750 MG chewable tablet Chew 1 tablet by mouth 2 (two) times daily.    . cetirizine (ZYRTEC) 10 MG chewable tablet Chew 10 mg by mouth daily.    . Cholecalciferol (VITAMIN D-3) 1000 units CAPS Take 2 capsules by mouth daily.    . clonazePAM (KLONOPIN) 1 MG tablet Take 1 mg by mouth 2 (two) times daily as needed for anxiety.    Marland Kitchen estradiol (ESTRACE) 0.5 MG tablet Take 0.5 mg by mouth daily.    . magnesium oxide (MAG-OX) 400 MG tablet Take 400 mg by mouth daily.    . Melatonin 5 MG CAPS Take 1 capsule by mouth daily as needed.    . Multiple Vitamins-Minerals (CENTRUM SILVER PO) Take 1 capsule by mouth daily.    . mycophenolate (MYFORTIC) 180 MG EC tablet Take 180 mg by mouth 4 (four) times daily.    Marland Kitchen NIFEdipine (PROCARDIA XL/ADALAT-CC) 60 MG 24 hr tablet Take 60 mg by mouth daily.    Marland Kitchen omeprazole (PRILOSEC) 20 MG capsule Take 20 mg by mouth daily.    . predniSONE (DELTASONE) 5 MG tablet Take 5 mg by mouth daily with breakfast.    . rosuvastatin (CRESTOR) 5 MG tablet Take 5 mg by mouth daily at 6 PM.     . Sulfamethoxazole-Trimethoprim (SULFAMETHOXAZOLE-TMP DS PO) Take 1 capsule by mouth 3 (three) times a week.     . tacrolimus (PROGRAF) 0.5 MG capsule Take 0.5 mg by mouth daily.    . tacrolimus (PROGRAF) 1 MG capsule Take 1  mg by mouth 2 (two) times daily.     No current facility-administered medications for this visit.    LABS/IMAGING: No results found for this or any previous visit (from the past 48 hour(s)). No results found.  WEIGHTS: Wt Readings from Last 3 Encounters:  02/18/16 214 lb 9.6 oz (97.342 kg)  02/11/16 213 lb (96.616 kg)  01/22/16 213 lb 9.6 oz (96.888 kg)    VITALS: BP 139/71 mmHg  Pulse 92  Ht 5\' 5"  (1.651 m)  Wt 214 lb 9.6 oz (97.342 kg)  BMI 35.71 kg/m2  EXAM: Deferred  EKG: Deferred  ASSESSMENT: 1. Angiographically normal coronary arteries by cath 2. Progressive dyspnea on exertion - low risk Myoview and echo demonstrating normal LVEF 60-65% 3. Abnormal EKG with poor R-wave progression 4. End-stage renal disease status post renal transplant on immunosuppressive  therapy 5. Hypertension 6. Dyslipidemia 7. Strong family history of coronary disease 8. Autosomal dominant polycystic kidney disease 9. Unstable angina  PLAN: 1.   Megan Small fortunately had no obstructive coronary disease on her cath but continues to have progressive shortness of breath on exertion. I like for her to undergo pulmonary function testing to see if there is a pulmonary cause of her shortness of breath. Certainly shortness of breath can be caused by her weight however this is been stable for some time and her symptoms are relatively new. If her PFTs are abnormal, I'll refer her to pulmonary.  Pixie Casino, MD, [facility] Attending Cardiologist Gibson C Hilty 02/18/2016, 5:59 PM

## 2016-03-01 ENCOUNTER — Ambulatory Visit (HOSPITAL_COMMUNITY)
Admission: RE | Admit: 2016-03-01 | Discharge: 2016-03-01 | Disposition: A | Discharge status: Home / Self Care | Payer: 59 | Source: Ambulatory Visit | Attending: Internal Medicine | Admitting: Internal Medicine

## 2016-03-01 DIAGNOSIS — R0602 Shortness of breath: Secondary | ICD-10-CM | POA: Diagnosis not present

## 2016-03-01 LAB — PULMONARY FUNCTION TEST
DL/VA % pred: 97 %
DL/VA: 4.7 ml/min/mmHg/L
DLCO UNC % PRED: 64 %
DLCO UNC: 15.71 ml/min/mmHg
FEF 25-75 POST: 1.78 L/s
FEF 25-75 PRE: 2.24 L/s
FEF2575-%Change-Post: -20 %
FEF2575-%PRED-POST: 86 %
FEF2575-%PRED-PRE: 108 %
FEV1-%Change-Post: 3 %
FEV1-%PRED-POST: 83 %
FEV1-%Pred-Pre: 80 %
FEV1-POST: 1.98 L
FEV1-Pre: 1.91 L
FEV1FVC-%Change-Post: -3 %
FEV1FVC-%PRED-PRE: 107 %
FEV6-%CHANGE-POST: 9 %
FEV6-%PRED-PRE: 76 %
FEV6-%Pred-Post: 83 %
FEV6-POST: 2.49 L
FEV6-Pre: 2.27 L
FEV6FVC-%PRED-POST: 104 %
FEV6FVC-%Pred-Pre: 104 %
FVC-%Change-Post: 7 %
FVC-%PRED-POST: 79 %
FVC-%Pred-Pre: 74 %
FVC-Post: 2.49 L
FVC-Pre: 2.31 L
POST FEV6/FVC RATIO: 100 %
PRE FEV1/FVC RATIO: 83 %
Post FEV1/FVC ratio: 79 %
Pre FEV6/FVC Ratio: 100 %
RV % pred: 69 %
RV: 1.47 L
TLC % PRED: 79 %
TLC: 3.99 L

## 2016-03-01 MED ORDER — ALBUTEROL SULFATE (2.5 MG/3ML) 0.083% IN NEBU
2.5000 mg | INHALATION_SOLUTION | Freq: Once | RESPIRATORY_TRACT | Status: AC
  Administered 2016-03-01: 2.5 mg via RESPIRATORY_TRACT

## 2016-03-02 ENCOUNTER — Other Ambulatory Visit: Payer: Self-pay | Admitting: *Deleted

## 2016-03-02 DIAGNOSIS — C44311 Basal cell carcinoma of skin of nose: Secondary | ICD-10-CM | POA: Diagnosis not present

## 2016-03-02 DIAGNOSIS — L570 Actinic keratosis: Secondary | ICD-10-CM | POA: Diagnosis not present

## 2016-03-02 DIAGNOSIS — Z85828 Personal history of other malignant neoplasm of skin: Secondary | ICD-10-CM | POA: Diagnosis not present

## 2016-03-02 DIAGNOSIS — R0602 Shortness of breath: Secondary | ICD-10-CM

## 2016-03-02 DIAGNOSIS — D485 Neoplasm of uncertain behavior of skin: Secondary | ICD-10-CM | POA: Diagnosis not present

## 2016-03-02 DIAGNOSIS — L820 Inflamed seborrheic keratosis: Secondary | ICD-10-CM | POA: Diagnosis not present

## 2016-03-02 DIAGNOSIS — L918 Other hypertrophic disorders of the skin: Secondary | ICD-10-CM | POA: Diagnosis not present

## 2016-03-02 DIAGNOSIS — B079 Viral wart, unspecified: Secondary | ICD-10-CM | POA: Diagnosis not present

## 2016-03-02 DIAGNOSIS — D2262 Melanocytic nevi of left upper limb, including shoulder: Secondary | ICD-10-CM | POA: Diagnosis not present

## 2016-03-02 DIAGNOSIS — C44319 Basal cell carcinoma of skin of other parts of face: Secondary | ICD-10-CM | POA: Diagnosis not present

## 2016-03-02 DIAGNOSIS — R942 Abnormal results of pulmonary function studies: Secondary | ICD-10-CM

## 2016-03-02 DIAGNOSIS — D225 Melanocytic nevi of trunk: Secondary | ICD-10-CM | POA: Diagnosis not present

## 2016-03-02 DIAGNOSIS — D692 Other nonthrombocytopenic purpura: Secondary | ICD-10-CM | POA: Diagnosis not present

## 2016-03-07 DIAGNOSIS — H43812 Vitreous degeneration, left eye: Secondary | ICD-10-CM | POA: Diagnosis not present

## 2016-03-21 ENCOUNTER — Encounter: Payer: Self-pay | Admitting: Internal Medicine

## 2016-03-21 ENCOUNTER — Ambulatory Visit (INDEPENDENT_AMBULATORY_CARE_PROVIDER_SITE_OTHER): Payer: 59 | Admitting: Internal Medicine

## 2016-03-21 VITALS — BP 128/70 | HR 82 | Ht 64.0 in | Wt 215.0 lb

## 2016-03-21 DIAGNOSIS — R053 Chronic cough: Secondary | ICD-10-CM | POA: Insufficient documentation

## 2016-03-21 DIAGNOSIS — J984 Other disorders of lung: Secondary | ICD-10-CM | POA: Diagnosis not present

## 2016-03-21 DIAGNOSIS — R05 Cough: Secondary | ICD-10-CM

## 2016-03-21 DIAGNOSIS — R0600 Dyspnea, unspecified: Secondary | ICD-10-CM | POA: Insufficient documentation

## 2016-03-21 DIAGNOSIS — R0602 Shortness of breath: Secondary | ICD-10-CM

## 2016-03-21 DIAGNOSIS — I200 Unstable angina: Secondary | ICD-10-CM

## 2016-03-21 NOTE — Patient Instructions (Addendum)
Follow up with Dr. Stevenson Clinch in:4 weeks

## 2016-03-21 NOTE — Progress Notes (Signed)
[facility] Consultation    Date: 03/21/2016  MRN# [id number] Megan Small 05-17-1951  Referring Physician:  PMD -  [name] is a 65 y.o. old female seen in consultation for   CC:  Chief Complaint  Patient presents with  . Advice Only    ref by Hilty: SOB w/excertion; occassional dry cough; heart cath 02/11/16:    HPI:  Patient is a pleasant 65 year old female with a past medical history of PCKD, status post right-sided renal transplant, hypertension, hyperlipidemia, chronic kidney disease, right arm AV fistula, obesity, seen in consultation for progressive shortness of breath. Patient stated that she's had a significant history for polycystic kidney disease and eventually required a kidney transplant, she also has a right arm fistula that has matured heaviness functioning appropriately. Patient states that since her kidney transplant about 4 years ago she has noticed shortness of breath that has been gradually getting worse however significantly worse since September 2016. There were no inciting events at that time such as upper spray tract infection severe allergy reaction or exposure to sick contacts. Review of her medications so that she is on chronic antirejection meds to include prednisone 5 mg daily, Myfortic 180 mg 4 times a day, Prograf 1 mg twice a day. Patient states that even walking to the mailbox and sites dyspnea. She had significant workup to include cardiac echo, left-sided cardiac catheterization, multiple chest x-rays, and a pulmonary function tests which showed a mild to moderate restriction. Today she is accompanied by her female partner. 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 Patient also endorses a cough with deep breathing or  exertion, nonproductive, describes as clearing of the throat. 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   Reviewed of Chart by Dr. Stevenson Clinch  Background:  65 y.o. 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 PRA 90% at time of transplant. 1-2-2 HLA antigen mismatch. Campath induction. 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 Transplant ureteral stent removed on 07/09/2012. Her post-transplant course was complicated by an early acute antibody mediated rejection. Her peak creatinine was 4.5. She received thymo, total of 15 sessions of plasmapheresis with 10 grams of IVIG following each session and rituximab. Her last pheresis was on 07/2012. F/up biopsy 07/06/2012 showed satisfactory treatment to antirejection treatment. 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 She did clear the CMV viremia. Most recently she has had low level BK viremia since 09/2012, serum viral loads have fluctuated. Her myfortic dose has fluctuated based on her BK viral load. She has also intermittent received cidofovir for BK viremia.    PMHX:   Past Medical History  Diagnosis Date  . Chronic kidney disease   . Hypertension   . Hyperlipidemia   . History of hiatal hernia   . Blood dyscrasia    Surgical Hx:  Past Surgical History  Procedure Laterality Date  . Kidney transplant    . Abdominal hysterectomy    . Shoulder surgery Left   . Pelvic prolapse    . Cystocele repair    . Rectocele repair    . Peripheral vascular catheterization N/A 09/09/2015  Procedure: A/V Shuntogram/Fistulagram;  Surgeon: Algernon Huxley, MD;  Location: [facility];  Service: Cardiovascular;  Laterality: N/A;  . Peripheral vascular catheterization N/A 09/09/2015    Procedure: A/V Shunt Intervention;  Surgeon: Algernon Huxley, MD;  Location: [facility];  Service: Cardiovascular;  Laterality: N/A;  . Cardiac catheterization N/A 02/11/2016    Procedure: Left Heart Cath and Coronary Angiography;  Surgeon: Lorretta Harp, MD;  Location: [facility];  Service: Cardiovascular;  Laterality: N/A;   Family Hx:  Family History  Problem Relation Age of Onset  . Pancreatic cancer Mother   . Aneurysm Mother     brain   . Heart disease Father   . Heart attack Father   . Hyperlipidemia Father   . Polycystic kidney disease Father   . Leukemia Father   . Stroke Maternal Grandmother   . Heart attack Paternal Grandmother   . Polycystic kidney disease Paternal Grandfather   . Pancreatic cancer Brother   . Polycystic kidney disease     Social Hx:   Social History  Substance Use Topics  . Smoking status: Never Smoker   . Smokeless tobacco: Never Used  . Alcohol Use: No   Medication:   Current Outpatient Rx  Name  Route  Sig  Dispense  Refill  . acetaminophen (TYLENOL) 500 MG tablet   Oral   Take 500 mg by mouth every 6 (six) hours as needed for mild pain.         Marland Kitchen aspirin 81 MG tablet   Oral   Take 81 mg by mouth daily.         . calcium carbonate (TUMS EX) 750 MG chewable tablet   Oral   Chew 1 tablet by mouth 2 (two) times daily.         . cetirizine (ZYRTEC) 10 MG chewable tablet   Oral   Chew 10 mg by mouth daily.         . Cholecalciferol (VITAMIN D-3) 1000 units CAPS   Oral   Take 2 capsules by mouth daily.         . clonazePAM (KLONOPIN) 1 MG tablet   Oral   Take 1 mg by mouth 2 (two) times daily as needed for anxiety.         Marland Kitchen estradiol (ESTRACE) 0.5 MG tablet   Oral   Take 0.5 mg by mouth daily.         . magnesium oxide (MAG-OX) 400 MG tablet   Oral   Take 400 mg by mouth daily.         . Melatonin 5 MG CAPS   Oral   Take 1 capsule by mouth daily as needed.         . Multiple Vitamins-Minerals (CENTRUM SILVER PO)   Oral   Take 1 capsule by mouth daily.         .  mycophenolate (MYFORTIC) 180 MG EC tablet   Oral   Take 180 mg by mouth 4 (four) times daily.         Marland Kitchen NIFEdipine (PROCARDIA XL/ADALAT-CC) 60 MG 24 hr tablet   Oral   Take 60 mg by mouth daily.         Marland Kitchen omeprazole (PRILOSEC) 20 MG capsule   Oral   Take 20 mg by mouth daily.         . predniSONE (DELTASONE) 5 MG tablet   Oral   Take 5 mg  by mouth daily with breakfast.         . rosuvastatin (CRESTOR) 5 MG tablet   Oral   Take 5 mg by mouth daily at 6 PM.          . Sulfamethoxazole-Trimethoprim (SULFAMETHOXAZOLE-TMP DS PO)   Oral   Take 1 capsule by mouth 3 (three) times a week.          . tacrolimus (PROGRAF) 0.5 MG capsule   Oral   Take 0.5 mg by mouth daily.         . tacrolimus (PROGRAF) 1 MG capsule   Oral   Take 1 mg by mouth 2 (two) times daily.             Allergies:  Lisinopril and Tape  Review of Systems  Constitutional: Negative for fever and chills.  HENT: Negative for sore throat.   Eyes: Negative for blurred vision.  Respiratory: Positive for cough, shortness of breath and wheezing. Negative for hemoptysis, sputum production and stridor.   Cardiovascular: Negative for chest pain.  Gastrointestinal: Negative for heartburn.  Genitourinary: Negative for dysuria.  Musculoskeletal: Positive for myalgias.  Skin: Negative for rash.  Neurological: Negative for dizziness.  Endo/Heme/Allergies: Bruises/bleeds easily.  Psychiatric/Behavioral: Negative for depression.     Physical Examination:   VS: BP 128/70 mmHg  Pulse 82  Ht _0  (1.626 m)  Wt 215 lb (97.523 kg)  BMI 36.89 kg/m2  SpO2 95%  General Appearance: No distress  Neuro:without focal findings, mental status, speech normal, alert and oriented, cranial nerves 2-12 intact, reflexes normal and symmetric, sensation grossly normal  HEENT: PERRLA, EOM intact, no ptosis, no other lesions noticed; Mallampati 3 Pulmonary: normal breath sounds., diaphragmatic excursion normal.No  wheezing, No rales;   Sputum Production:  none CardiovascularNormal S1,S2.  No m/r/g.  Abdominal aorta pulsation normal.    Abdomen: Benign, Soft, non-tender, No masses, hepatosplenomegaly, No lymphadenopathy Renal:  No costovertebral tenderness  GU:  No performed at this time. Endoc: No evident thyromegaly, no signs of acromegaly or Cushing features Skin:   warm, no rashes, no ecchymosis  Extremities: normal, no cyanosis, clubbing, mild trace LE edema, warm with normal capillary refill. Other findings:none    Rad results: (The following images and results were reviewed by Dr. Stevenson Clinch on 03/21/2016). CXR 12/2015 EXAM: CHEST 2 VIEW  COMPARISON: Chest radiograph from 09/14/2008  FINDINGS: The lungs are well-aerated. Minimal left basilar atelectasis is noted. There is no evidence of pleural effusion or pneumothorax.  The heart is normal in size; the mediastinal contour is within normal limits. No acute osseous abnormalities are seen.  IMPRESSION: Minimal left basilar atelectasis noted. Lungs otherwise clear.   Other: V/Q Scan 12/2015 CLINICAL DATA: Acute onset of shortness of breath and fatigue on exertion. Initial encounter.  EXAM: NUCLEAR MEDICINE VENTILATION - PERFUSION LUNG SCAN  TECHNIQUE: Ventilation images were obtained in multiple projections using inhaled aerosol Tc-45mDTPA. Perfusion images were obtained in multiple projections after intravenous injection of Tc-961mAA.  RADIOPHARMACEUTICALS: 30.682 mCi Technetium-9963mPA and 4.014 mCi Technetium-62m42m IV  COMPARISON: None.  FINDINGS: Ventilation: No focal ventilation defect. Ventilation images demonstrate normal accumulation of activity within both lungs.  Perfusion: No wedge shaped peripheral perfusion defects to suggest acute pulmonary embolism.  IMPRESSION: Normal V/Q scan.  ECHO 01/2016  - Left ventricle: The cavity size was normal. Wall thickness was  normal. Systolic function was  normal. The estimated ejection  fraction was in the range of 60% to 65%. Wall motion  was normal;  there were no regional wall motion abnormalities. Doppler  parameters are consistent with abnormal left ventricular  relaxation (grade 1 diastolic dysfunction). - Tricuspid valve: There was trivial regurgitation. - Pulmonary arteries: Systolic pressure was within the normal  range.  Left Heart Cath 02/11/16 "Mrs. Raush has completely normal coronary arteries and normal LVEDP.Her 2-D echo was normal as well.I do not think her symptoms are cardiovascular in etiology"  Pulmonary function testing 03/01/2016 FVC 74% FEV1 80% FEV1/FVC 83% RV 69% TLC 79% RV/TLC 87% DLCO 64% Interpretation: Significant response to bronchodilator, reduced lung volumes, increased FEV1/FVC ratio and diffusion defects just for interstitial process. Mild-moderate reduction in DLCO, suggest decrease alveolar capillary surface function.  Assessment and Plan: 65 year old female seen in consultation for worsening dyspnea, history of kidney transplant. Restrictive lung disease Patient with noted restrictive lung cyst on her most recent pulmonary function testing. There is decrease in the FVC along with FEV1 with a preserved FEV1/FVC ratio, along with RV and TLC decreases, this is suggestive of a restrictive process, more likely interstitial/parenchymal. The differential for interstitial lung disease is numerous. However given in this particular patient, with a history of chronic kidney disease is post renal transplant on chronic rejection meds with a history of CMV viremia and now low PK viremia, bronchiolitis and its subclasses high on differential.  Her BK levels are checked on a regular basis along with her CMV levels. Antirejection meds can cause restrictive lung disease and manifests as his dyspnea. Other etiologies for her dyspnea such as cardiac has been significantly worked up with cardiac echogram, left heart  cath, with no significant findings to suggest alternative etiologies for her dyspnea.  Plan: -Continue current antirejection meds -High-resolution CAT scan without contrast to further evaluate lung parenchyma and interstitial lung disease.   Shortness of breath Differential: Medication induced from chronic antirejection medication, interstitial lung disease, restrictive lung disease, obesity, deconditioning.  I believe at this time patient has a type of interstitial lung disease, this could be related to her prolonged use of antirejection medications, in particular interest for this patient mycophenolate which has shown to have cases of pulmonary fibrosis.  Plan: -Current antirejection meds to be continued -High-resolution CAT scan without contrast.  Dyspnea see plan for shortness of breath  Chronic cough Multifactorial: Possible interstitial lung disease, medication induced, restrictive lung disease.  Supportive treatment at this time until further workup with high-resolution CAT scan and further evaluation.    Updated Medication List Outpatient Encounter Prescriptions as of 03/21/2016  Medication Sig  . acetaminophen (TYLENOL) 500 MG tablet Take 500 mg by mouth every 6 (six) hours as needed for mild pain.  Marland Kitchen aspirin 81 MG tablet Take 81 mg by mouth daily.  . calcium carbonate (TUMS EX) 750 MG chewable tablet Chew 1 tablet by mouth 2 (two) times daily.  . cetirizine (ZYRTEC) 10 MG chewable tablet Chew 10 mg by mouth daily.  . Cholecalciferol (VITAMIN D-3) 1000 units CAPS Take 2 capsules by mouth daily.  . clonazePAM (KLONOPIN) 1 MG tablet Take 1 mg by mouth 2 (two) times daily as needed for anxiety.  Marland Kitchen estradiol (ESTRACE) 0.5 MG tablet Take 0.5 mg by mouth daily.  . magnesium oxide (MAG-OX) 400 MG tablet Take 400 mg by mouth daily.  . Melatonin 5 MG CAPS Take 1 capsule by mouth daily as needed.  . Multiple Vitamins-Minerals (CENTRUM SILVER PO) Take 1 capsule by mouth daily.  .  mycophenolate (MYFORTIC) 180 MG EC tablet Take 180 mg by  mouth 4 (four) times daily.  Marland Kitchen NIFEdipine (PROCARDIA XL/ADALAT-CC) 60 MG 24 hr tablet Take 60 mg by mouth daily.  Marland Kitchen omeprazole (PRILOSEC) 20 MG capsule Take 20 mg by mouth daily.  . predniSONE (DELTASONE) 5 MG tablet Take 5 mg by mouth daily with breakfast.  . rosuvastatin (CRESTOR) 5 MG tablet Take 5 mg by mouth daily at 6 PM.   . Sulfamethoxazole-Trimethoprim (SULFAMETHOXAZOLE-TMP DS PO) Take 1 capsule by mouth 3 (three) times a week.   . tacrolimus (PROGRAF) 0.5 MG capsule Take 0.5 mg by mouth daily.  . tacrolimus (PROGRAF) 1 MG capsule Take 1 mg by mouth 2 (two) times daily.   No facility-administered encounter medications on file as of 03/21/2016.    Orders for this visit: Orders Placed This Encounter  Procedures  . CT CHEST HIGH RESOLUTION    Standing Status: Future     Number of Occurrences:      Standing Expiration Date: 05/22/2017    Scheduling Instructions:     W/o contrast    Order Specific Question:  Reason for Exam (SYMPTOM  OR DIAGNOSIS REQUIRED)    Answer:  chronic cough, dyspnea    Order Specific Question:  Preferred imaging location?    Answer:  [facility]     Thank  you for the consultation and for allowing [facility] to assist in the care of your patient. Our recommendations are noted above.  Please contact us if we can be of further service.   Vilinda Boehringer, MD [facility] Office Number: [phone]  Note: This note was prepared with Dragon dictation along with smaller phrase technology. Any transcriptional errors that result from this process are unintentional.

## 2016-03-22 NOTE — Assessment & Plan Note (Signed)
Multifactorial: Possible interstitial lung disease, medication induced, restrictive lung disease.  Supportive treatment at this time until further workup with high-resolution CAT scan and further evaluation.

## 2016-03-22 NOTE — Assessment & Plan Note (Signed)
see plan for shortness of breath

## 2016-03-22 NOTE — Assessment & Plan Note (Signed)
Differential: Medication induced from chronic antirejection medication, interstitial lung disease, restrictive lung disease, obesity, deconditioning.  I believe at this time patient has a type of interstitial lung disease, this could be related to her prolonged use of antirejection medications, in particular interest for this patient mycophenolate which has shown to have cases of pulmonary fibrosis.  Plan: -Current antirejection meds to be continued -High-resolution CAT scan without contrast.

## 2016-03-22 NOTE — Assessment & Plan Note (Signed)
Patient with noted restrictive lung cyst on her most recent pulmonary function testing. There is decrease in the FVC along with FEV1 with a preserved FEV1/FVC ratio, along with RV and TLC decreases, this is suggestive of a restrictive process, more likely interstitial/parenchymal. The differential for interstitial lung disease is numerous. However given in this particular patient, with a history of chronic kidney disease is post renal transplant on chronic rejection meds with a history of CMV viremia and now low PK viremia, bronchiolitis and its subclasses high on differential.  Her BK levels are checked on a regular basis along with her CMV levels. Antirejection meds can cause restrictive lung disease and manifests as his dyspnea. Other etiologies for her dyspnea such as cardiac has been significantly worked up with cardiac echogram, left heart cath, with no significant findings to suggest alternative etiologies for her dyspnea.  Plan: -Continue current antirejection meds -High-resolution CAT scan without contrast to further evaluate lung parenchyma and interstitial lung disease.

## 2016-03-28 ENCOUNTER — Ambulatory Visit
Admission: RE | Admit: 2016-03-28 | Discharge: 2016-03-28 | Disposition: A | Discharge status: Home / Self Care | Payer: 59 | Source: Ambulatory Visit | Attending: Internal Medicine | Admitting: Internal Medicine

## 2016-03-28 DIAGNOSIS — Z7689 Persons encountering health services in other specified circumstances: Secondary | ICD-10-CM | POA: Diagnosis not present

## 2016-03-28 DIAGNOSIS — R05 Cough: Secondary | ICD-10-CM | POA: Insufficient documentation

## 2016-03-28 DIAGNOSIS — I2510 Atherosclerotic heart disease of native coronary artery without angina pectoris: Secondary | ICD-10-CM | POA: Diagnosis not present

## 2016-03-28 DIAGNOSIS — R0600 Dyspnea, unspecified: Secondary | ICD-10-CM | POA: Diagnosis not present

## 2016-03-28 DIAGNOSIS — Z940 Kidney transplant status: Secondary | ICD-10-CM | POA: Diagnosis not present

## 2016-03-28 DIAGNOSIS — R911 Solitary pulmonary nodule: Secondary | ICD-10-CM | POA: Diagnosis not present

## 2016-03-28 DIAGNOSIS — R918 Other nonspecific abnormal finding of lung field: Secondary | ICD-10-CM | POA: Diagnosis not present

## 2016-03-28 DIAGNOSIS — I700 Atherosclerosis of aorta: Secondary | ICD-10-CM | POA: Insufficient documentation

## 2016-03-28 DIAGNOSIS — R053 Chronic cough: Secondary | ICD-10-CM

## 2016-03-29 ENCOUNTER — Telehealth: Payer: Self-pay | Admitting: *Deleted

## 2016-03-29 DIAGNOSIS — C44311 Basal cell carcinoma of skin of nose: Secondary | ICD-10-CM | POA: Diagnosis not present

## 2016-03-29 DIAGNOSIS — Z85828 Personal history of other malignant neoplasm of skin: Secondary | ICD-10-CM | POA: Diagnosis not present

## 2016-03-29 NOTE — Telephone Encounter (Signed)
-----   Message from Vilinda Boehringer, MD sent at 03/29/2016  1:02 PM EDT ----- Regarding: CT results Please inform patient that her high resolution CAT scan results are normal. There are no findings to suggest any type of interstitial lung disease. Further details can be discussed at follow-up visit

## 2016-03-29 NOTE — Telephone Encounter (Signed)
Pt informed. Nothing further needed. 

## 2016-03-30 ENCOUNTER — Ambulatory Visit: Admission: RE | Admit: 2016-03-30 | Payer: 59 | Source: Ambulatory Visit

## 2016-04-06 DIAGNOSIS — H43812 Vitreous degeneration, left eye: Secondary | ICD-10-CM | POA: Diagnosis not present

## 2016-04-07 ENCOUNTER — Ambulatory Visit (INDEPENDENT_AMBULATORY_CARE_PROVIDER_SITE_OTHER): Payer: 59 | Admitting: Internal Medicine

## 2016-04-07 ENCOUNTER — Encounter (INDEPENDENT_AMBULATORY_CARE_PROVIDER_SITE_OTHER): Payer: Self-pay

## 2016-04-07 ENCOUNTER — Encounter: Payer: Self-pay | Admitting: Internal Medicine

## 2016-04-07 VITALS — BP 118/68 | HR 85 | Ht 65.0 in | Wt 215.0 lb

## 2016-04-07 DIAGNOSIS — R053 Chronic cough: Secondary | ICD-10-CM

## 2016-04-07 DIAGNOSIS — R0609 Other forms of dyspnea: Secondary | ICD-10-CM

## 2016-04-07 DIAGNOSIS — R05 Cough: Secondary | ICD-10-CM

## 2016-04-07 DIAGNOSIS — I200 Unstable angina: Secondary | ICD-10-CM

## 2016-04-07 DIAGNOSIS — R0602 Shortness of breath: Secondary | ICD-10-CM

## 2016-04-07 DIAGNOSIS — J984 Other disorders of lung: Secondary | ICD-10-CM

## 2016-04-07 MED ORDER — FLUTICASONE FUROATE 100 MCG/ACT IN AEPB: 1.0000 | INHALATION_SPRAY | Freq: Every day | RESPIRATORY_TRACT | 0 refills | Status: AC

## 2016-04-07 NOTE — Assessment & Plan Note (Addendum)
Differential: Medication induced from chronic antirejection medication, mild restrictive lung disease, obesity, deconditioning.  Review of high-resolution CAT scan along with pulmonary function tests, does not show any significant findings for interstitial lung disease or bronchiolitis. Her pulmonary function test show significant response to bronchodilators, with mild restriction and small airway obstruction; is also correlated on her high-resolution CAT scan. Given the nonsignificant findings on high-resolution CAT scan, along with bronchodilator response on pulmonary function testing, we will give her a trial of inhaled corticosteroids.  Will keep in mind that some rejection meds such as mycophenolate which has shown to have cases of pulmonary fibrosis, however HRCT is not suggestive of this at current time.   Plan: -Current antirejection meds to be continued -Arnuity 11mcg, 1 puff daily x 2 weeks, she will call back for prescription if clinical improvement is seen.

## 2016-04-07 NOTE — Assessment & Plan Note (Signed)
Multifactorial: medication induced, restrictive lung disease.   Plan: Trial of ICS

## 2016-04-07 NOTE — Assessment & Plan Note (Addendum)
See plan for SOB, in addition: diet, exercise, ICS 10-15 times per day.

## 2016-04-07 NOTE — Progress Notes (Signed)
[facility] Consultation      MRN# [id number] Megan Small 1950/11/16   CC: Chief Complaint  Patient presents with  . 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 PFTs with mild restriction/obstruction, HRCT with air trapping, no ILD findings. Trial of ICS given significant response to BD on PFTs   Events since last clinic visit: Patient presents today for follow-up visit of her shortness of breath along with cough. Since her last visit she's had a high-resolution CAT scan, that showed some mild air trapping, serum history results below. She still endorses a mild intermittent cough with deep inspiration, and she endorses dyspnea on exertion. No recent sick contacts, no fever no chills, no sputum production, no hemoptysis. Overall her primary status has been about the same since her last visit.     Review of Systems  Constitutional: Negative for fever.  Eyes: Negative for blurred vision.  Respiratory: Positive for shortness of breath. Negative for cough, sputum production and wheezing.   Cardiovascular: Negative for chest pain.  Gastrointestinal: Negative for heartburn and nausea.  Genitourinary: Negative for dysuria.  Musculoskeletal: Negative for myalgias.  Skin: Negative for rash.  Neurological: Negative for dizziness and headaches.  Endo/Heme/Allergies: Does not bruise/bleed easily.  Psychiatric/Behavioral: Negative for depression.      Allergies:  Lisinopril and Tape  Physical Examination:  VS: BP 118/68 (BP Location: Left Arm, Cuff Size: Normal)   Pulse 85   Ht 5\' 5"  (1.651 m)   Wt 215 lb (97.5 kg)   SpO2 98%   BMI 35.78 kg/m   General Appearance: No distress  HEENT: PERRLA, no ptosis, no other lesions noticed Pulmonary:normal breath sounds., diaphragmatic excursion normal.No wheezing, No rales   Cardiovascular:  Normal  S1,S2.  No m/r/g.     Abdomen:Exam: Benign, Soft, non-tender, No masses  Skin:   warm, no rashes, no ecchymosis  Extremities: normal, no cyanosis, clubbing, warm with normal capillary refill.      Rad results: (The following images and results were reviewed by Dr. Stevenson Clinch on 04/07/2016). HRCT 03/28/16 EXAM: CT CHEST WITHOUT CONTRAST TECHNIQUE: Multidetector CT imaging of the chest was performed following the standard protocol without intravenous contrast. High resolution imaging of the lungs, as well as inspiratory and expiratory imaging, was performed. COMPARISON:  No priors. FINDINGS: Cardiovascular: Heart size is normal. There is no significant pericardial fluid, thickening or pericardial calcification. There is aortic atherosclerosis, as well as atherosclerosis of the great vessels of the mediastinum and the coronary arteries, including calcified atherosclerotic plaque in the right coronary artery. Mediastinum/Nodes: No pathologically enlarged mediastinal or hilar lymph nodes. Please note that accurate exclusion of hilar adenopathy is limited on noncontrast CT scans. Small hiatal hernia. No axillary lymphadenopathy.  Lungs/Pleura: High-resolution images demonstrate no significant regions of ground-glass attenuation, subpleural reticulation, traction bronchiectasis or frank honeycombing to indicate interstitial lung disease. There are some areas of mild linear scarring in the lung bases bilaterally. Inspiratory and expiratory imaging demonstrates mild air trapping, indicative of mild small airways disease. No acute consolidative airspace disease. No pleural effusions. 3 mm pulmonary nodule in the right middle lobe (image 79 of series 3) is highly nonspecific. No larger more suspicious appearing pulmonary nodules or masses are otherwise noted.  Upper Abdomen: Innumerable low-attenuation lesions scattered throughout the liver, incompletely characterized on today's noncontrast CT  examination, but similar to prior  study, likely multiple cysts, with the largest of these measuring up to 4.4 cm in segments 7 and 8. Superior aspects of the kidneys are incompletely visualized, but also demonstrate innumerable predominantly low-attenuation lesions. There also several high attenuation lesions in the kidneys bilaterally. These are all incompletely characterized on today's noncontrast CT examination, but also likely represent a combination of simple cysts and proteinaceous/hemorrhagic cysts, with the largest hemorrhagic cyst in the upper pole of the right kidney measuring 2.6 cm in diameter. Aortic atherosclerosis. Musculoskeletal: There are no aggressive appearing lytic or blastic lesions noted in the visualized portions of the skeleton.  IMPRESSION: 1. No findings to suggest interstitial lung disease. 2. Tiny 3 mm right middle lobe pulmonary nodules nonspecific and statistically likely benign. No follow-up needed if patient is low-risk. Non-contrast chest CT can be considered in 12 months if patient is high-risk. This recommendation follows the consensus statement: Guidelines for Management of Incidental Pulmonary Nodules Detected on CT Images:From the Fleischner Society 2017; published online before print (10.1148/radiol.IJ:2314499). 3. Aortic atherosclerosis, in addition to right coronary artery disease. Please note that although the presence of coronary artery calcium documents the presence of coronary artery disease, the severity of this disease and any potential stenosis cannot be assessed on this non-gated CT examination. Assessment for potential risk factor modification, dietary therapy or pharmacologic therapy may be warranted, if clinically indicated. 4. 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      Assessment and Plan:65 yo female seen in follow-up visit for chronic shortness of breath over the  past 4 years after kidney transplant Shortness of breath Differential: Medication induced from chronic antirejection medication, mild restrictive lung disease, obesity, deconditioning.  Review of high-resolution CAT scan along with pulmonary function tests, does not show any significant findings for interstitial lung disease or bronchiolitis. Her pulmonary function test show significant response to bronchodilators, with mild restriction and small airway obstruction; is also correlated on her high-resolution CAT scan. Given the nonsignificant findings on high-resolution CAT scan, along with bronchodilator response on pulmonary function testing, we will give her a trial of inhaled corticosteroids.  Will keep in mind that some rejection meds such as mycophenolate which has shown to have cases of pulmonary fibrosis, however HRCT is not suggestive of this at current time.   Plan: -Current antirejection meds to be continued -Arnuity 124mcg, 1 puff daily x 2 weeks, she will call back for prescription if clinical improvement is seen.  Restrictive lung disease Patient with noted mild restrictive lung findings on her most recent pulmonary function testing. There is decrease in the FVC along with FEV1 with a preserved FEV1/FVC ratio, along with RV and TLC decreases, this is suggestive of a restrictive process, more likely interstitial/parenchymal; however her HRCT did not show any findings of ILD, only some small airway trapping.  Antirejection meds can cause restrictive lung disease and manifests as his dyspnea.  Given PFTs findings along with HRCT findings, will give a trial of ICS for significant bronchodilator response on PFTs, and small airway trapping on high-resolution CT.  Plan: -Continue current antirejection meds -trail of ICS (Arnuity).    DOE (dyspnea on exertion) See plan for SOB, in addition: diet, exercise, ICS 10-15 times per day.   Chronic cough Multifactorial: medication induced,  restrictive lung disease.   Plan: Trial of ICS   Updated Medication List Outpatient Encounter Prescriptions as of 04/07/2016  Medication Sig  . acetaminophen (TYLENOL) 500 MG tablet Take 500 mg by  mouth every 6 (six) hours as needed for mild pain.  Marland Kitchen aspirin 81 MG tablet Take 81 mg by mouth daily.  . calcium carbonate (TUMS EX) 750 MG chewable tablet Chew 1 tablet by mouth 2 (two) times daily.  . cetirizine (ZYRTEC) 10 MG chewable tablet Chew 10 mg by mouth daily.  . Cholecalciferol (VITAMIN D-3) 1000 units CAPS Take 2 capsules by mouth daily.  . clonazePAM (KLONOPIN) 1 MG tablet Take 1 mg by mouth 2 (two) times daily as needed for anxiety.  Marland Kitchen estradiol (ESTRACE) 0.5 MG tablet Take 0.5 mg by mouth daily.  . magnesium oxide (MAG-OX) 400 MG tablet Take 400 mg by mouth daily.  . Melatonin 5 MG CAPS Take 1 capsule by mouth daily as needed.  . Multiple Vitamins-Minerals (CENTRUM SILVER PO) Take 1 capsule by mouth daily.  . mycophenolate (MYFORTIC) 180 MG EC tablet Take 180 mg by mouth 4 (four) times daily.  Marland Kitchen NIFEdipine (PROCARDIA XL/ADALAT-CC) 60 MG 24 hr tablet Take 60 mg by mouth daily.  Marland Kitchen omeprazole (PRILOSEC) 20 MG capsule Take 20 mg by mouth daily.  . predniSONE (DELTASONE) 5 MG tablet Take 5 mg by mouth daily with breakfast.  . rosuvastatin (CRESTOR) 5 MG tablet Take 5 mg by mouth daily at 6 PM.   . Sulfamethoxazole-Trimethoprim (SULFAMETHOXAZOLE-TMP DS PO) Take 1 capsule by mouth 3 (three) times a week.   . tacrolimus (PROGRAF) 0.5 MG capsule Take 0.5 mg by mouth daily.  . tacrolimus (PROGRAF) 1 MG capsule Take 1 mg by mouth 2 (two) times daily.  . Fluticasone Furoate (ARNUITY ELLIPTA) 100 MCG/ACT AEPB Inhale 1 puff into the lungs daily.   No facility-administered encounter medications on file as of 04/07/2016.     Orders for this visit: No orders of the defined types were placed in this encounter.   Thank  you for the visitation and for allowing  [facility]  to assist in the care of your patient. Our recommendations are noted above.  Please contact us if we can be of further service.  Vilinda Boehringer, MD [facility] Office Number: [phone]  Note: This note was prepared with Dragon dictation along with smaller phrase technology. Any transcriptional errors that result from this process are unintentional.

## 2016-04-07 NOTE — Progress Notes (Signed)
Patient ID: Megan Small, female   DOB: 05/26/1951, 65 y.o.   MRN: [id number]  Patient seen in the office today and instructed on use of Arnuity.  Patient expressed understanding and demonstrated technique. Oscar La, LPN

## 2016-04-07 NOTE — Assessment & Plan Note (Addendum)
Patient with noted mild restrictive lung findings on her most recent pulmonary function testing. There is decrease in the FVC along with FEV1 with a preserved FEV1/FVC ratio, along with RV and TLC decreases, this is suggestive of a restrictive process, more likely interstitial/parenchymal; however her HRCT did not show any findings of ILD, only some small airway trapping.  Antirejection meds can cause restrictive lung disease and manifests as his dyspnea.  Given PFTs findings along with HRCT findings, will give a trial of ICS for significant bronchodilator response on PFTs, and small airway trapping on high-resolution CT.  Plan: -Continue current antirejection meds -trail of ICS (Arnuity).

## 2016-04-07 NOTE — Patient Instructions (Addendum)
Follow up with Dr. Stevenson Clinch in:3 months - cont with diet and exercise as tolerated.  - Arnuity 133mcg, 1 puff daily . -gargle and rinse after each use.2 week sample given.  Call us back for a RX if this med is providing clinical benefit.  - try to walk 54mins a day, minimum 3 times a week - incentive spirometry 10-15 times per day

## 2016-04-12 DIAGNOSIS — Z6836 Body mass index (BMI) 36.0-36.9, adult: Secondary | ICD-10-CM | POA: Diagnosis not present

## 2016-04-12 DIAGNOSIS — E785 Hyperlipidemia, unspecified: Secondary | ICD-10-CM | POA: Diagnosis not present

## 2016-04-12 DIAGNOSIS — I10 Essential (primary) hypertension: Secondary | ICD-10-CM | POA: Diagnosis not present

## 2016-04-12 DIAGNOSIS — B349 Viral infection, unspecified: Secondary | ICD-10-CM | POA: Diagnosis not present

## 2016-04-12 DIAGNOSIS — Z940 Kidney transplant status: Secondary | ICD-10-CM | POA: Diagnosis not present

## 2016-04-12 DIAGNOSIS — Z85828 Personal history of other malignant neoplasm of skin: Secondary | ICD-10-CM | POA: Diagnosis not present

## 2016-04-12 DIAGNOSIS — Q612 Polycystic kidney, adult type: Secondary | ICD-10-CM | POA: Diagnosis not present

## 2016-04-12 DIAGNOSIS — J984 Other disorders of lung: Secondary | ICD-10-CM | POA: Diagnosis not present

## 2016-04-12 DIAGNOSIS — I770 Arteriovenous fistula, acquired: Secondary | ICD-10-CM | POA: Diagnosis not present

## 2016-04-15 ENCOUNTER — Encounter: Payer: Self-pay | Admitting: Emergency Medicine

## 2016-04-15 ENCOUNTER — Emergency Department: Payer: 59

## 2016-04-15 ENCOUNTER — Emergency Department
Admission: EM | Admit: 2016-04-15 | Discharge: 2016-04-15 | Disposition: A | Discharge status: Home / Self Care | Payer: 59 | Attending: Emergency Medicine | Admitting: Emergency Medicine

## 2016-04-15 DIAGNOSIS — N189 Chronic kidney disease, unspecified: Secondary | ICD-10-CM | POA: Diagnosis not present

## 2016-04-15 DIAGNOSIS — M79672 Pain in left foot: Secondary | ICD-10-CM

## 2016-04-15 DIAGNOSIS — Z79899 Other long term (current) drug therapy: Secondary | ICD-10-CM | POA: Diagnosis not present

## 2016-04-15 DIAGNOSIS — I129 Hypertensive chronic kidney disease with stage 1 through stage 4 chronic kidney disease, or unspecified chronic kidney disease: Secondary | ICD-10-CM | POA: Insufficient documentation

## 2016-04-15 DIAGNOSIS — M722 Plantar fascial fibromatosis: Secondary | ICD-10-CM | POA: Diagnosis not present

## 2016-04-15 DIAGNOSIS — Z7982 Long term (current) use of aspirin: Secondary | ICD-10-CM | POA: Diagnosis not present

## 2016-04-15 MED ORDER — HYDROCODONE-ACETAMINOPHEN 5-325 MG PO TABS: 1.0000 | ORAL_TABLET | Freq: Four times a day (QID) | ORAL | 0 refills | Status: AC | PRN

## 2016-04-15 MED ORDER — HYDROCODONE-ACETAMINOPHEN 5-325 MG PO TABS
1.0000 | ORAL_TABLET | ORAL | Status: AC
  Administered 2016-04-15: 1 via ORAL
  Filled 2016-04-15: qty 1

## 2016-04-15 NOTE — ED Provider Notes (Signed)
[facility] Emergency Department Provider Note   ____________________________________________   First MD Initiated Contact with Patient 04/15/16 [phone]     (approximate)  I have reviewed the triage vital signs and the nursing notes.   HISTORY  Chief Complaint Foot Pain    HPI Megan Small is a 65 y.o. female who presents for evaluation of pain in the left foot.  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 No trauma or injury. No fevers or chills. No numbness tingling or weakness in the foot. Reports that when she got out of bed this morning to stepdown pain in the back of her was severe, made worse by any attempt to walk. Comfortable while sitting still. She does report a previous history of plantar fasciitis but in the other foot, and recalls that being slightly different in intensity.    Past Medical History:  Diagnosis Date  . Blood dyscrasia   . Chronic kidney disease   . History of hiatal hernia   . Hyperlipidemia   . Hypertension     Patient Active Problem List   Diagnosis Date Noted  . Dyspnea 03/21/2016  . Chronic cough 03/21/2016  . Restrictive lung disease 03/21/2016  . Chest pain on exertion   . DOE (dyspnea on exertion)   . Unstable angina ([place]) 01/22/2016  . Shortness of breath 01/08/2016  . Other fatigue 01/08/2016  . Dyslipidemia 01/08/2016  . Family history of coronary artery disease 01/08/2016  . History of renal transplant 01/08/2016    Past Surgical History:  Procedure Laterality Date  . ABDOMINAL HYSTERECTOMY    . CARDIAC CATHETERIZATION N/A 02/11/2016   Procedure: Left Heart Cath and Coronary Angiography;  Surgeon: Lorretta Harp, MD;  Location: [facility];  Service: Cardiovascular;  Laterality: N/A;  . CYSTOCELE REPAIR    . KIDNEY TRANSPLANT    .  pelvic prolapse    . PERIPHERAL VASCULAR CATHETERIZATION N/A 09/09/2015   Procedure: A/V Shuntogram/Fistulagram;  Surgeon: Algernon Huxley, MD;  Location: [facility];  Service: Cardiovascular;  Laterality: N/A;  . PERIPHERAL VASCULAR CATHETERIZATION N/A 09/09/2015   Procedure: A/V Shunt Intervention;  Surgeon: Algernon Huxley, MD;  Location: [facility];  Service: Cardiovascular;  Laterality: N/A;  . RECTOCELE REPAIR    . SHOULDER SURGERY Left     Prior to Admission medications   Medication Sig Start Date End Date Taking? Authorizing Provider  acetaminophen (TYLENOL) 500 MG tablet Take 500 mg by mouth every 6 (six) hours as needed for mild pain.    Historical Provider, MD  aspirin 81 MG tablet Take 81 mg by mouth daily.    Historical Provider, MD  calcium carbonate (TUMS EX) 750 MG chewable tablet Chew 1 tablet by mouth 2 (two) times daily.    Historical Provider, MD  cetirizine (ZYRTEC) 10 MG chewable tablet Chew 10 mg by mouth daily.    Historical Provider, MD  Cholecalciferol (VITAMIN D-3) 1000 units CAPS Take 2 capsules by mouth daily.    Historical Provider, MD  clonazePAM (KLONOPIN) 1 MG tablet Take 1 mg by mouth 2 (two) times daily as needed for anxiety.    Historical Provider, MD  estradiol (ESTRACE) 0.5 MG tablet Take 0.5 mg by mouth daily.    Historical Provider, MD  HYDROcodone-acetaminophen (NORCO/VICODIN) 5-325  MG tablet Take 1 tablet by mouth every 6 (six) hours as needed for moderate pain. 04/15/16   Delman Kitten, MD  magnesium oxide (MAG-OX) 400 MG tablet Take 400 mg by mouth daily.    Historical Provider, MD  Melatonin 5 MG CAPS Take 1 capsule by mouth daily as needed.    Historical Provider, MD  Multiple Vitamins-Minerals (CENTRUM SILVER PO) Take 1 capsule by mouth daily.    Historical Provider, MD  mycophenolate (MYFORTIC) 180 MG EC tablet Take 180 mg by mouth 4 (four) times daily.    Historical Provider, MD  NIFEdipine (PROCARDIA XL/ADALAT-CC) 60 MG 24 hr tablet Take 60  mg by mouth daily.    Historical Provider, MD  omeprazole (PRILOSEC) 20 MG capsule Take 20 mg by mouth daily.    Historical Provider, MD  predniSONE (DELTASONE) 5 MG tablet Take 5 mg by mouth daily with breakfast.    Historical Provider, MD  rosuvastatin (CRESTOR) 5 MG tablet Take 5 mg by mouth daily at 6 PM.     Historical Provider, MD  Sulfamethoxazole-Trimethoprim (SULFAMETHOXAZOLE-TMP DS PO) Take 1 capsule by mouth 3 (three) times a week.     Historical Provider, MD  tacrolimus (PROGRAF) 0.5 MG capsule Take 0.5 mg by mouth daily.    Historical Provider, MD  tacrolimus (PROGRAF) 1 MG capsule Take 1 mg by mouth 2 (two) times daily.    Historical Provider, MD    Allergies Lisinopril and Tape  Family History  Problem Relation Age of Onset  . Pancreatic cancer Mother   . Aneurysm Mother     brain   . Heart disease Father   . Heart attack Father   . Hyperlipidemia Father   . Polycystic kidney disease Father   . Leukemia Father   . Stroke Maternal Grandmother   . Heart attack Paternal Grandmother   . Polycystic kidney disease Paternal Grandfather   . Pancreatic cancer Brother   . Polycystic kidney disease      Social History Social History  Substance Use Topics  . Smoking status: Never Smoker  . Smokeless tobacco: Never Used  . Alcohol use No    Review of Systems Constitutional: No fever/chills Cardiovascular: Denies chest pain. Respiratory: Denies shortness of breath. Gastrointestinal: No abdominal pain.  No nausea, no vomiting.  Musculoskeletal: Negative for back pain. Skin: Negative for rash. Neurological: Negative for headaches, focal weakness or numbness.  10-point ROS otherwise negative.  ____________________________________________   PHYSICAL EXAM:  VITAL SIGNS: ED Triage Vitals [04/15/16 0840]  Enc Vitals Group     BP      Pulse      Resp      Temp      Temp src      SpO2      Weight 212 lb (96.2 kg)     Height 5\' 5"  (1.651 m)     Head  Circumference      Peak Flow      Pain Score 8     Pain Loc      Pain Edu?      Excl. in [place]?     Constitutional: Alert and oriented. Well appearing and in no acute distress. Eyes: Conjunctivae are normal.  Head: Atraumatic. Nose: No congestion/rhinnorhea. Mouth/Throat: Mucous membranes are moist.   Neck: No stridor.   Cardiovascular: Normal rate, regular rhythm.  Respiratory: Normal respiratory effort.  No retractions. Gastrointestinal: Soft and nontender. No distention. No abdominal bruits. No CVA tenderness. Musculoskeletal:   Lower Extremities  No edema. Normal DP/PT pulses bilateral with good cap refill.  Normal neuro-motor function lower extremities bilateral.  RIGHT Right lower extremity demonstrates normal strength, good use of all muscles. No edema bruising or contusions of the right hip, right knee, right ankle. Full range of motion of the right lower extremity without pain. No pain on axial loading. No evidence of trauma.  LEFT Left lower extremity demonstrates normal strength, good use of all muscles. No edema bruising or contusions of the hip,  knee, ankle. Full range of motion of the left lower extremity without pain except when dorsiflexing the toes and foot, pressure over the posterior heel causes pain to radiate across the bottom of the foot reproducing discomfort. No pain on axial loading. No evidence of trauma.   Neurologic:  Normal speech and language. No gross focal neurologic deficits are appreciated. No gait instability. Skin:  Skin is warm, dry and intact. No rash noted. Psychiatric: Mood and affect are normal. Speech and behavior are normal.  ____________________________________________   LABS (all labs ordered are listed, but only abnormal results are displayed)  Labs Reviewed - No data to display ____________________________________________  EKG   ____________________________________________  RADIOLOGY  Dg Foot Complete Left  Result Date:  04/15/2016 CLINICAL DATA:  Left foot pain. EXAM: LEFT FOOT - COMPLETE 3+ VIEW COMPARISON:  None. FINDINGS: There is no evidence of fracture or dislocation. There is mild hallux valgus. No significant arthropathy identified. No bony lesions or erosions. Soft tissues are unremarkable. IMPRESSION: No acute fracture.  Mild hallux valgus. Electronically Signed   By: Aletta Edouard M.D.   On: 04/15/2016 09:18    ____________________________________________   PROCEDURES  Procedure(s) performed: None  Procedures  Critical Care performed: No  ____________________________________________   INITIAL IMPRESSION / ASSESSMENT AND PLAN / ED COURSE  Pertinent labs & imaging results that were available during my care of the patient were reviewed by me and considered in my medical decision making (see chart for details).  Isolated left foot pain. Neurovascular intact. No trauma. No evidence of infection, no evidence of joint effusions. Appears most consistent with likely plantar fasciitis. We'll treat as such.   I will prescribe the patient a narcotic pain medicine due to their condition which I anticipate will cause at least moderate pain short term. I discussed with the patient safe use of narcotic pain medicines, and that they are not to drive, work in dangerous areas, or ever take more than prescribed (no more than 1 pill every 6 hours). We discussed that this is the type of medication that can be  overdosed on and the risks of this type of medicine. Patient is very agreeable to only use as prescribed and to never use more than prescribed.  Patient and partner agreeable with the plan to follow-up and monitor symptoms. If concerning symptoms arise he'll return to the emergency room right away.  Clinical Course     ____________________________________________   FINAL CLINICAL IMPRESSION(S) / ED DIAGNOSES  Final diagnoses:  Foot pain, left  Plantar fasciitis of left foot      NEW  MEDICATIONS STARTED DURING THIS VISIT:  New Prescriptions   HYDROCODONE-ACETAMINOPHEN (NORCO/VICODIN) 5-325 MG TABLET    Take 1 tablet by mouth every 6 (six) hours as needed for moderate pain.     Note:  This document was prepared using Dragon voice recognition software and may include unintentional dictation errors.     Delman Kitten, MD 04/15/16 [phone]

## 2016-04-15 NOTE — Discharge Instructions (Signed)
Please follow-up with podiatry.  Return to the emergency room if you develop numbness, weakness, fever, swelling, a cold or blue left foot or other new concerns arise.

## 2016-04-15 NOTE — ED Triage Notes (Signed)
Patient presents to the ED with left foot pain that began suddenly this morning.  Patient states she cannot bear weight on her left foot.  Patient denies known injury to foot.  Patient states she walked yesterday normally but when she went to bed she noticed her foot was hurting a little, but this morning it is very painful.  Patient denies history of similar pain.

## 2016-04-18 ENCOUNTER — Telehealth: Payer: Self-pay | Admitting: Internal Medicine

## 2016-04-18 MED ORDER — FLUTICASONE FUROATE 100 MCG/ACT IN AEPB: 1.0000 | INHALATION_SPRAY | Freq: Every day | RESPIRATORY_TRACT | 5 refills | Status: DC

## 2016-04-18 MED FILL — ARNUITY ELLIPTA 100 MCG INH: 100 | 30 days supply | Qty: 30 | Fill #0 | Status: TO

## 2016-04-18 NOTE — Telephone Encounter (Signed)
RX sent to [facility]. Pt informed. Nothing further needed.

## 2016-04-29 DIAGNOSIS — Z992 Dependence on renal dialysis: Secondary | ICD-10-CM | POA: Diagnosis not present

## 2016-04-29 DIAGNOSIS — N186 End stage renal disease: Secondary | ICD-10-CM | POA: Diagnosis not present

## 2016-04-29 DIAGNOSIS — T82318A Breakdown (mechanical) of other vascular grafts, initial encounter: Secondary | ICD-10-CM | POA: Diagnosis not present

## 2016-04-29 DIAGNOSIS — I10 Essential (primary) hypertension: Secondary | ICD-10-CM | POA: Diagnosis not present

## 2016-04-29 DIAGNOSIS — E785 Hyperlipidemia, unspecified: Secondary | ICD-10-CM | POA: Diagnosis not present

## 2016-04-29 DIAGNOSIS — Z940 Kidney transplant status: Secondary | ICD-10-CM | POA: Diagnosis not present

## 2016-04-29 DIAGNOSIS — Y841 Kidney dialysis as the cause of abnormal reaction of the patient, or of later complication, without mention of misadventure at the time of the procedure: Secondary | ICD-10-CM | POA: Diagnosis not present

## 2016-04-29 DIAGNOSIS — T82858A Stenosis of vascular prosthetic devices, implants and grafts, initial encounter: Secondary | ICD-10-CM | POA: Diagnosis not present

## 2016-04-29 MED FILL — MYFORTIC 180 MG TABLET: 180 | 30 days supply | Qty: 120 | Fill #0 | Status: TO

## 2016-05-27 MED FILL — MYFORTIC 180 MG TABLET: 180 | 30 days supply | Qty: 120 | Fill #0

## 2016-06-20 ENCOUNTER — Other Ambulatory Visit: Payer: Self-pay | Admitting: Obstetrics & Gynecology

## 2016-06-20 DIAGNOSIS — Z940 Kidney transplant status: Secondary | ICD-10-CM | POA: Diagnosis not present

## 2016-06-20 DIAGNOSIS — Z792 Long term (current) use of antibiotics: Secondary | ICD-10-CM | POA: Diagnosis not present

## 2016-06-20 DIAGNOSIS — Z23 Encounter for immunization: Secondary | ICD-10-CM | POA: Diagnosis not present

## 2016-06-20 DIAGNOSIS — Z4822 Encounter for aftercare following kidney transplant: Secondary | ICD-10-CM | POA: Diagnosis not present

## 2016-06-20 DIAGNOSIS — Z79899 Other long term (current) drug therapy: Secondary | ICD-10-CM | POA: Diagnosis not present

## 2016-06-20 DIAGNOSIS — D8989 Other specified disorders involving the immune mechanism, not elsewhere classified: Secondary | ICD-10-CM | POA: Diagnosis not present

## 2016-06-20 DIAGNOSIS — I10 Essential (primary) hypertension: Secondary | ICD-10-CM | POA: Diagnosis not present

## 2016-06-20 DIAGNOSIS — Z1231 Encounter for screening mammogram for malignant neoplasm of breast: Secondary | ICD-10-CM

## 2016-07-21 DIAGNOSIS — J984 Other disorders of lung: Secondary | ICD-10-CM | POA: Diagnosis not present

## 2016-07-21 DIAGNOSIS — Z940 Kidney transplant status: Secondary | ICD-10-CM | POA: Diagnosis not present

## 2016-07-21 DIAGNOSIS — Z6836 Body mass index (BMI) 36.0-36.9, adult: Secondary | ICD-10-CM | POA: Diagnosis not present

## 2016-07-21 DIAGNOSIS — I10 Essential (primary) hypertension: Secondary | ICD-10-CM | POA: Diagnosis not present

## 2016-07-21 DIAGNOSIS — E785 Hyperlipidemia, unspecified: Secondary | ICD-10-CM | POA: Diagnosis not present

## 2016-07-21 DIAGNOSIS — Q612 Polycystic kidney, adult type: Secondary | ICD-10-CM | POA: Diagnosis not present

## 2016-07-21 DIAGNOSIS — Z85828 Personal history of other malignant neoplasm of skin: Secondary | ICD-10-CM | POA: Diagnosis not present

## 2016-07-21 DIAGNOSIS — I770 Arteriovenous fistula, acquired: Secondary | ICD-10-CM | POA: Diagnosis not present

## 2016-08-01 DIAGNOSIS — Z940 Kidney transplant status: Secondary | ICD-10-CM | POA: Diagnosis not present

## 2016-08-01 DIAGNOSIS — I10 Essential (primary) hypertension: Secondary | ICD-10-CM | POA: Diagnosis not present

## 2016-08-05 ENCOUNTER — Ambulatory Visit
Admission: RE | Admit: 2016-08-05 | Discharge: 2016-08-05 | Disposition: A | Discharge status: Home / Self Care | Payer: 59 | Source: Ambulatory Visit | Attending: Obstetrics & Gynecology | Admitting: Obstetrics & Gynecology

## 2016-08-05 DIAGNOSIS — Z1231 Encounter for screening mammogram for malignant neoplasm of breast: Secondary | ICD-10-CM

## 2016-08-08 ENCOUNTER — Ambulatory Visit: Payer: 59 | Admitting: Internal Medicine

## 2016-08-09 ENCOUNTER — Ambulatory Visit (INDEPENDENT_AMBULATORY_CARE_PROVIDER_SITE_OTHER): Payer: 59 | Admitting: Internal Medicine

## 2016-08-09 ENCOUNTER — Encounter: Payer: Self-pay | Admitting: Internal Medicine

## 2016-08-09 VITALS — BP 122/76 | HR 76 | Wt 203.0 lb

## 2016-08-09 DIAGNOSIS — I200 Unstable angina: Secondary | ICD-10-CM

## 2016-08-09 DIAGNOSIS — J984 Other disorders of lung: Secondary | ICD-10-CM | POA: Diagnosis not present

## 2016-08-09 DIAGNOSIS — R0602 Shortness of breath: Secondary | ICD-10-CM

## 2016-08-09 NOTE — Patient Instructions (Signed)
Follow up with Dr. Juanell Fairly in 3 months -Continue current antirejection meds - Cont with Arnuity - cont with wt. Loss.

## 2016-08-09 NOTE — Progress Notes (Signed)
[facility] Consultation      MRN# [id number] Megan Small 07/04/1951   CC: Chief Complaint  Patient presents with  . 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 PFTs with mild restriction/obstruction, HRCT with air trapping, no ILD findings. Trial of ICS given significant response to BD on PFTs   Events since last clinic visit: Patient presents today for follow-up visit of her shortness of breath along with cough. Currently on Arnuity, with some clinical difference. Started weight watcher, lost 13 lbs in 1 month, and has had improvement in breathing.  No fever, no chills, no worsening cough.  Still with some mild DOE, but significantly improved.     Review of Systems  Constitutional: Negative for fever.  Eyes: Negative for blurred vision.  Respiratory: Positive for shortness of breath. Negative for cough, sputum production and wheezing.   Cardiovascular: Negative for chest pain.  Gastrointestinal: Negative for heartburn and nausea.  Genitourinary: Negative for dysuria.  Musculoskeletal: Negative for myalgias.  Skin: Negative for rash.  Neurological: Negative for dizziness and headaches.  Endo/Heme/Allergies: Does not bruise/bleed easily.  Psychiatric/Behavioral: Negative for depression.      Allergies:  Lisinopril and Tape  Physical Examination:  VS: BP 122/76 (BP Location: Left Arm, Cuff Size: Normal)   Pulse 76   Wt 203 lb (92.1 kg)   SpO2 97%   BMI 33.78 kg/m   General Appearance: No distress  HEENT: PERRLA, no ptosis, no other lesions noticed Pulmonary:normal breath sounds., diaphragmatic excursion normal.No wheezing, No rales   Cardiovascular:  Normal S1,S2.  No m/r/g.     Abdomen:Exam: Benign, Soft, non-tender, No masses  Skin:   warm, no rashes, no ecchymosis  Extremities: normal, no cyanosis,  clubbing, warm with normal capillary refill.      Rad results: (The following images and results were reviewed by Dr. Stevenson Clinch on 08/09/2016). HRCT 03/28/16 EXAM: CT CHEST WITHOUT CONTRAST TECHNIQUE: Multidetector CT imaging of the chest was performed following the standard protocol without intravenous contrast. High resolution imaging of the lungs, as well as inspiratory and expiratory imaging, was performed. COMPARISON:  No priors. FINDINGS: Cardiovascular: Heart size is normal. There is no significant pericardial fluid, thickening or pericardial calcification. There is aortic atherosclerosis, as well as atherosclerosis of the great vessels of the mediastinum and the coronary arteries, including calcified atherosclerotic plaque in the right coronary artery. Mediastinum/Nodes: No pathologically enlarged mediastinal or hilar lymph nodes. Please note that accurate exclusion of hilar adenopathy is limited on noncontrast CT scans. Small hiatal hernia. No axillary lymphadenopathy.  Lungs/Pleura: High-resolution images demonstrate no significant regions of ground-glass attenuation, subpleural reticulation, traction bronchiectasis or frank honeycombing to indicate interstitial lung disease. There are some areas of mild linear scarring in the lung bases bilaterally. Inspiratory and expiratory imaging demonstrates mild air trapping, indicative of mild small airways disease. No acute consolidative airspace disease. No pleural effusions. 3 mm pulmonary nodule in the right middle lobe (image 79 of series 3) is highly nonspecific. No larger more suspicious appearing pulmonary nodules or masses are otherwise noted.  Upper Abdomen: Innumerable low-attenuation lesions scattered throughout the liver, incompletely characterized on today's noncontrast CT examination, but similar to prior study, likely multiple cysts, with the largest of these measuring up to 4.4 cm in segments 7 and 8. Superior  aspects of the kidneys  are incompletely visualized, but also demonstrate innumerable predominantly low-attenuation lesions. There also several high attenuation lesions in the kidneys bilaterally. These are all incompletely characterized on today's noncontrast CT examination, but also likely represent a combination of simple cysts and proteinaceous/hemorrhagic cysts, with the largest hemorrhagic cyst in the upper pole of the right kidney measuring 2.6 cm in diameter. Aortic atherosclerosis. Musculoskeletal: There are no aggressive appearing lytic or blastic lesions noted in the visualized portions of the skeleton.  IMPRESSION: 1. No findings to suggest interstitial lung disease. 2. Tiny 3 mm right middle lobe pulmonary nodules nonspecific and statistically likely benign. No follow-up needed if patient is low-risk. Non-contrast chest CT can be considered in 12 months if patient is high-risk. This recommendation follows the consensus statement: Guidelines for Management of Incidental Pulmonary Nodules Detected on CT Images:From the Fleischner Society 2017; published online before print (10.1148/radiol.IJ:2314499). 3. Aortic atherosclerosis, in addition to right coronary artery disease. Please note that although the presence of coronary artery calcium documents the presence of coronary artery disease, the severity of this disease and any potential stenosis cannot be assessed on this non-gated CT examination. Assessment for potential risk factor modification, dietary therapy or pharmacologic therapy may be warranted, if clinically indicated. 4. 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      Assessment and Plan:65 yo female seen in follow-up visit for chronic shortness of breath over the past 4 years after kidney transplant Restrictive lung disease Patient with noted mild restrictive lung findings on her most recent pulmonary  function testing. There is decrease in the FVC along with FEV1 with a preserved FEV1/FVC ratio, along with RV and TLC decreases, this is suggestive of a restrictive process, more likely interstitial/parenchymal; however her HRCT did not show any findings of ILD, only some small airway trapping.  Antirejection meds can cause restrictive lung disease and manifests as his dyspnea.  Given PFTs findings along with HRCT findings, will give a trial of ICS for significant bronchodilator response on PFTs, and small airway trapping on high-resolution CT.  Plan: -Continue current antirejection meds - Cont with Arnuity - cont with wt. Loss.    Shortness of breath Differential: Medication induced from chronic antirejection medication, mild restrictive lung disease, obesity, deconditioning.  Review of high-resolution CAT scan along with pulmonary function tests, does not show any significant findings for interstitial lung disease or bronchiolitis. Her pulmonary function test show significant response to bronchodilators, with mild restriction and small airway obstruction; is also correlated on her high-resolution CAT scan. Given the nonsignificant findings on high-resolution CAT scan, along with bronchodilator response on pulmonary function testing, we will give her a trial of inhaled corticosteroids.  Will keep in mind that some rejection meds such as mycophenolate which has shown to have cases of pulmonary fibrosis, however HRCT is not suggestive of this at current time.   Plan: -Current antirejection meds to be continued -Arnuity 166mcg daily  - wt. loss   Updated Medication List Outpatient Encounter Prescriptions as of 08/09/2016  Medication Sig  . acetaminophen (TYLENOL) 500 MG tablet Take 500 mg by mouth every 6 (six) hours as needed for mild pain.  Marland Kitchen aspirin 81 MG tablet Take 81 mg by mouth daily.  . calcium carbonate (TUMS EX) 750 MG chewable tablet Chew 1 tablet by mouth 2 (two) times daily  as needed.   . cetirizine (ZYRTEC) 10 MG chewable tablet Chew 10 mg by mouth daily.  . Cholecalciferol (VITAMIN D-3) 1000 units  CAPS Take 2 capsules by mouth 2 (two) times daily.   . clonazePAM (KLONOPIN) 1 MG tablet Take 1 mg by mouth 2 (two) times daily as needed for anxiety.  Marland Kitchen estradiol (ESTRACE) 0.5 MG tablet Take 0.5 mg by mouth daily.  . Fluticasone Furoate (ARNUITY ELLIPTA) 100 MCG/ACT AEPB Inhale 1 puff into the lungs daily.  Marland Kitchen HYDROcodone-acetaminophen (NORCO/VICODIN) 5-325 MG tablet Take 1 tablet by mouth every 6 (six) hours as needed for moderate pain.  . magnesium oxide (MAG-OX) 400 MG tablet Take 400 mg by mouth daily.  . Melatonin 5 MG CAPS Take 1 capsule by mouth daily as needed.  . Multiple Vitamins-Minerals (CENTRUM SILVER PO) Take 1 capsule by mouth daily.  . mycophenolate (MYFORTIC) 180 MG EC tablet Take 180 mg by mouth 4 (four) times daily.  Marland Kitchen NIFEdipine (PROCARDIA XL/ADALAT-CC) 60 MG 24 hr tablet Take 60 mg by mouth daily.  Marland Kitchen omeprazole (PRILOSEC) 20 MG capsule Take 20 mg by mouth daily.  . predniSONE (DELTASONE) 5 MG tablet Take 5 mg by mouth daily with breakfast.  . rosuvastatin (CRESTOR) 5 MG tablet Take 5 mg by mouth daily at 6 PM.   . Sulfamethoxazole-Trimethoprim (SULFAMETHOXAZOLE-TMP DS PO) Take 1 capsule by mouth 3 (three) times a week.   . tacrolimus (PROGRAF) 0.5 MG capsule Take 0.5 mg by mouth daily.  . tacrolimus (PROGRAF) 1 MG capsule Take 1 mg by mouth 2 (two) times daily.   No facility-administered encounter medications on file as of 08/09/2016.     Orders for this visit: No orders of the defined types were placed in this encounter.   Thank  you for the visitation and for allowing  [facility] to assist in the care of your patient. Our recommendations are noted above.  Please contact us if we can be of further service.  Vilinda Boehringer, MD [facility] Office Number: [phone]  Note: This note was  prepared with Dragon dictation along with smaller phrase technology. Any transcriptional errors that result from this process are unintentional.

## 2016-08-09 NOTE — Assessment & Plan Note (Signed)
Patient with noted mild restrictive lung findings on her most recent pulmonary function testing. There is decrease in the FVC along with FEV1 with a preserved FEV1/FVC ratio, along with RV and TLC decreases, this is suggestive of a restrictive process, more likely interstitial/parenchymal; however her HRCT did not show any findings of ILD, only some small airway trapping.  Antirejection meds can cause restrictive lung disease and manifests as his dyspnea.  Given PFTs findings along with HRCT findings, will give a trial of ICS for significant bronchodilator response on PFTs, and small airway trapping on high-resolution CT.  Plan: -Continue current antirejection meds - Cont with Arnuity - cont with wt. Loss.

## 2016-08-09 NOTE — Assessment & Plan Note (Signed)
Differential: Medication induced from chronic antirejection medication, mild restrictive lung disease, obesity, deconditioning.  Review of high-resolution CAT scan along with pulmonary function tests, does not show any significant findings for interstitial lung disease or bronchiolitis. Her pulmonary function test show significant response to bronchodilators, with mild restriction and small airway obstruction; is also correlated on her high-resolution CAT scan. Given the nonsignificant findings on high-resolution CAT scan, along with bronchodilator response on pulmonary function testing, we will give her a trial of inhaled corticosteroids.  Will keep in mind that some rejection meds such as mycophenolate which has shown to have cases of pulmonary fibrosis, however HRCT is not suggestive of this at current time.   Plan: -Current antirejection meds to be continued -Arnuity 144mcg daily  - wt. loss

## 2016-08-22 DIAGNOSIS — D2271 Melanocytic nevi of right lower limb, including hip: Secondary | ICD-10-CM | POA: Diagnosis not present

## 2016-08-22 DIAGNOSIS — L821 Other seborrheic keratosis: Secondary | ICD-10-CM | POA: Diagnosis not present

## 2016-08-22 DIAGNOSIS — D225 Melanocytic nevi of trunk: Secondary | ICD-10-CM | POA: Diagnosis not present

## 2016-08-22 DIAGNOSIS — Z85828 Personal history of other malignant neoplasm of skin: Secondary | ICD-10-CM | POA: Diagnosis not present

## 2016-08-22 DIAGNOSIS — D692 Other nonthrombocytopenic purpura: Secondary | ICD-10-CM | POA: Diagnosis not present

## 2016-09-01 DIAGNOSIS — Z940 Kidney transplant status: Secondary | ICD-10-CM | POA: Diagnosis not present

## 2016-09-01 DIAGNOSIS — I10 Essential (primary) hypertension: Secondary | ICD-10-CM | POA: Diagnosis not present

## 2016-09-06 DIAGNOSIS — Z6832 Body mass index (BMI) 32.0-32.9, adult: Secondary | ICD-10-CM | POA: Diagnosis not present

## 2016-09-06 DIAGNOSIS — Z01419 Encounter for gynecological examination (general) (routine) without abnormal findings: Secondary | ICD-10-CM | POA: Diagnosis not present

## 2016-10-04 DIAGNOSIS — I10 Essential (primary) hypertension: Secondary | ICD-10-CM | POA: Diagnosis not present

## 2016-10-04 DIAGNOSIS — Z940 Kidney transplant status: Secondary | ICD-10-CM | POA: Diagnosis not present

## 2016-10-06 DIAGNOSIS — Z940 Kidney transplant status: Secondary | ICD-10-CM | POA: Diagnosis not present

## 2016-10-06 DIAGNOSIS — Z0000 Encounter for general adult medical examination without abnormal findings: Secondary | ICD-10-CM | POA: Diagnosis not present

## 2016-10-06 DIAGNOSIS — I700 Atherosclerosis of aorta: Secondary | ICD-10-CM | POA: Diagnosis not present

## 2016-10-06 DIAGNOSIS — Z23 Encounter for immunization: Secondary | ICD-10-CM | POA: Diagnosis not present

## 2016-10-06 DIAGNOSIS — Z1389 Encounter for screening for other disorder: Secondary | ICD-10-CM | POA: Diagnosis not present

## 2016-10-06 DIAGNOSIS — E7800 Pure hypercholesterolemia, unspecified: Secondary | ICD-10-CM | POA: Diagnosis not present

## 2016-10-06 DIAGNOSIS — I10 Essential (primary) hypertension: Secondary | ICD-10-CM | POA: Diagnosis not present

## 2016-10-24 DIAGNOSIS — Q612 Polycystic kidney, adult type: Secondary | ICD-10-CM | POA: Diagnosis not present

## 2016-10-24 DIAGNOSIS — I10 Essential (primary) hypertension: Secondary | ICD-10-CM | POA: Diagnosis not present

## 2016-10-24 DIAGNOSIS — J984 Other disorders of lung: Secondary | ICD-10-CM | POA: Diagnosis not present

## 2016-10-24 DIAGNOSIS — I770 Arteriovenous fistula, acquired: Secondary | ICD-10-CM | POA: Diagnosis not present

## 2016-10-24 DIAGNOSIS — E785 Hyperlipidemia, unspecified: Secondary | ICD-10-CM | POA: Diagnosis not present

## 2016-10-24 DIAGNOSIS — Z6832 Body mass index (BMI) 32.0-32.9, adult: Secondary | ICD-10-CM | POA: Diagnosis not present

## 2016-10-24 DIAGNOSIS — Z940 Kidney transplant status: Secondary | ICD-10-CM | POA: Diagnosis not present

## 2016-10-24 DIAGNOSIS — N2581 Secondary hyperparathyroidism of renal origin: Secondary | ICD-10-CM | POA: Diagnosis not present

## 2016-10-24 DIAGNOSIS — Z85828 Personal history of other malignant neoplasm of skin: Secondary | ICD-10-CM | POA: Diagnosis not present

## 2016-10-31 ENCOUNTER — Other Ambulatory Visit (INDEPENDENT_AMBULATORY_CARE_PROVIDER_SITE_OTHER): Payer: Self-pay | Admitting: Vascular Surgery

## 2016-10-31 DIAGNOSIS — N185 Chronic kidney disease, stage 5: Secondary | ICD-10-CM

## 2016-10-31 DIAGNOSIS — T82590A Other mechanical complication of surgically created arteriovenous fistula, initial encounter: Secondary | ICD-10-CM

## 2016-11-01 ENCOUNTER — Ambulatory Visit (INDEPENDENT_AMBULATORY_CARE_PROVIDER_SITE_OTHER): Payer: 59 | Admitting: Vascular Surgery

## 2016-11-01 ENCOUNTER — Encounter (INDEPENDENT_AMBULATORY_CARE_PROVIDER_SITE_OTHER): Payer: Self-pay

## 2016-11-01 ENCOUNTER — Ambulatory Visit (INDEPENDENT_AMBULATORY_CARE_PROVIDER_SITE_OTHER): Payer: 59

## 2016-11-01 ENCOUNTER — Encounter (INDEPENDENT_AMBULATORY_CARE_PROVIDER_SITE_OTHER): Payer: Self-pay | Admitting: Vascular Surgery

## 2016-11-01 VITALS — BP 121/74 | HR 64 | Resp 16 | Ht 64.0 in | Wt 186.0 lb

## 2016-11-01 DIAGNOSIS — T829XXD Unspecified complication of cardiac and vascular prosthetic device, implant and graft, subsequent encounter: Secondary | ICD-10-CM | POA: Diagnosis not present

## 2016-11-01 DIAGNOSIS — T829XXA Unspecified complication of cardiac and vascular prosthetic device, implant and graft, initial encounter: Secondary | ICD-10-CM | POA: Insufficient documentation

## 2016-11-01 DIAGNOSIS — T82590A Other mechanical complication of surgically created arteriovenous fistula, initial encounter: Secondary | ICD-10-CM

## 2016-11-01 DIAGNOSIS — N185 Chronic kidney disease, stage 5: Secondary | ICD-10-CM

## 2016-11-01 DIAGNOSIS — E785 Hyperlipidemia, unspecified: Secondary | ICD-10-CM | POA: Diagnosis not present

## 2016-11-01 DIAGNOSIS — Z940 Kidney transplant status: Secondary | ICD-10-CM | POA: Diagnosis not present

## 2016-11-01 NOTE — Assessment & Plan Note (Signed)
lipid control important in reducing the progression of atherosclerotic disease. Continue statin therapy  

## 2016-11-01 NOTE — Assessment & Plan Note (Signed)
Her duplex today shows stable moderately elevated velocities at the cephalic vein subclavian vein confluence. The remainder of the AV fistula appears to be patent. This has required multiple interventions in the past. We will try to keep her AV fistula available since she was high risk with her transplant for rejection, and she will likely outlive the transplant. Recheck in 1 year.

## 2016-11-01 NOTE — Progress Notes (Signed)
MRN : [id number]  Megan Small is a 66 y.o. (1950-09-17) female who presents with chief complaint of  Chief Complaint  Patient presents with  . Follow-up  .  History of Present Illness: Patient returns today in follow up of her right arm AVF. She is currently not using her fistula is her transplant continues to function well. Her duplex today shows stable moderately elevated velocities at the cephalic vein subclavian vein confluence. The remainder of the AV fistula appears to be patent.  Current Outpatient Prescriptions  Medication Sig Dispense Refill  . acetaminophen (TYLENOL) 500 MG tablet Take 500 mg by mouth every 6 (six) hours as needed for mild pain.    Marland Kitchen aspirin 81 MG tablet Take 81 mg by mouth daily.    . calcium carbonate (TUMS EX) 750 MG chewable tablet Chew 1 tablet by mouth 2 (two) times daily as needed.     . cetirizine (ZYRTEC) 10 MG chewable tablet Chew 10 mg by mouth daily.    . Cholecalciferol (VITAMIN D-3) 1000 units CAPS Take 2 capsules by mouth 2 (two) times daily.     . clonazePAM (KLONOPIN) 1 MG tablet Take 1 mg by mouth 2 (two) times daily as needed for anxiety.    Marland Kitchen estradiol (ESTRACE) 0.5 MG tablet Take 0.5 mg by mouth daily.    . Fluticasone Furoate (ARNUITY ELLIPTA) 100 MCG/ACT AEPB Inhale 1 puff into the lungs daily. 30 each 5  . HYDROcodone-acetaminophen (NORCO/VICODIN) 5-325 MG tablet Take 1 tablet by mouth every 6 (six) hours as needed for moderate pain. 30 tablet 0  . magnesium oxide (MAG-OX) 400 MG tablet Take 400 mg by mouth daily.    . Melatonin 5 MG CAPS Take 1 capsule by mouth daily as needed.    . Multiple Vitamins-Minerals (CENTRUM SILVER PO) Take 1 capsule by mouth daily.    . mycophenolate (MYFORTIC) 180 MG EC tablet Take 180 mg by mouth 4 (four) times daily.    Marland Kitchen NIFEdipine (PROCARDIA XL/ADALAT-CC) 60 MG 24 hr tablet Take 60 mg by mouth daily.    Marland Kitchen omeprazole (PRILOSEC) 20 MG capsule Take 20 mg by mouth daily.    . predniSONE (DELTASONE) 5 MG  tablet Take 5 mg by mouth daily with breakfast.    . rosuvastatin (CRESTOR) 5 MG tablet Take 5 mg by mouth daily at 6 PM.     . Sulfamethoxazole-Trimethoprim (SULFAMETHOXAZOLE-TMP DS PO) Take 1 capsule by mouth 3 (three) times a week.     . tacrolimus (PROGRAF) 0.5 MG capsule Take 0.5 mg by mouth daily.    . tacrolimus (PROGRAF) 1 MG capsule Take 1 mg by mouth 2 (two) times daily.     No current facility-administered medications for this visit.     Past Medical History:  Diagnosis Date  . Blood dyscrasia   . Chronic kidney disease   . History of hiatal hernia   . Hyperlipidemia   . Hypertension     Past Surgical History:  Procedure Laterality Date  . ABDOMINAL HYSTERECTOMY    . CARDIAC CATHETERIZATION N/A 02/11/2016   Procedure: Left Heart Cath and Coronary Angiography;  Surgeon: Lorretta Harp, MD;  Location: [facility];  Service: Cardiovascular;  Laterality: N/A;  . CYSTOCELE REPAIR    . KIDNEY TRANSPLANT    . pelvic prolapse    . PERIPHERAL VASCULAR CATHETERIZATION N/A 09/09/2015   Procedure: A/V Shuntogram/Fistulagram;  Surgeon: Algernon Huxley, MD;  Location: [facility];  Service:  Cardiovascular;  Laterality: N/A;  . PERIPHERAL VASCULAR CATHETERIZATION N/A 09/09/2015   Procedure: A/V Shunt Intervention;  Surgeon: Algernon Huxley, MD;  Location: [facility];  Service: Cardiovascular;  Laterality: N/A;  . RECTOCELE REPAIR    . SHOULDER SURGERY Left     Social History Social History  Substance Use Topics  . Smoking status: Never Smoker  . Smokeless tobacco: Never Used  . Alcohol use No    Family History Family History  Problem Relation Age of Onset  . Pancreatic cancer Mother   . Aneurysm Mother     brain   . Heart disease Father   . Heart attack Father   . Hyperlipidemia Father   . Polycystic kidney disease Father   . Leukemia Father   . Stroke Maternal Grandmother   . Heart attack Paternal Grandmother   . Polycystic kidney disease Paternal  Grandfather   . Pancreatic cancer Brother   . Polycystic kidney disease      Allergies  Allergen Reactions  . Lisinopril Swelling  . Tape Rash    Ok with Paper Tape  Plastic tape tears skin     REVIEW OF SYSTEMS (Negative unless checked)  Constitutional: [] Weight loss  [] Fever  [] Chills Cardiac: [] Chest pain   [] Chest pressure   [] Palpitations   [] Shortness of breath when laying flat   [] Shortness of breath at rest   [] Shortness of breath with exertion. Vascular:  [] Pain in legs with walking   [] Pain in legs at rest   [] Pain in legs when laying flat   [] Claudication   [] Pain in feet when walking  [] Pain in feet at rest  [] Pain in feet when laying flat   [] History of DVT   [] Phlebitis   [] Swelling in legs   [] Varicose veins   [] Non-healing ulcers Pulmonary:   [] Uses home oxygen   [] Productive cough   [] Hemoptysis   [] Wheeze  [] COPD   [] Asthma Neurologic:  [] Dizziness  [] Blackouts   [] Seizures   [] History of stroke   [] History of TIA  [] Aphasia   [] Temporary blindness   [] Dysphagia   [] Weakness or numbness in arms   [] Weakness or numbness in legs Musculoskeletal:  [] Arthritis   [] Joint swelling   [] Joint pain   [] Low back pain Hematologic:  [] Easy bruising  [] Easy bleeding   [] Hypercoagulable state   [] Anemic   Gastrointestinal:  [] Blood in stool   [] Vomiting blood  [] Gastroesophageal reflux/heartburn   [] Abdominal pain Genitourinary:  [x] Chronic kidney disease   [] Difficult urination  [] Frequent urination  [] Burning with urination   [] Hematuria Skin:  [] Rashes   [] Ulcers   [] Wounds Psychological:  [] History of anxiety   []  History of major depression.  Physical Examination  BP 121/74   Pulse 64   Resp 16   Ht 5\' 4"  (1.626 m)   Wt 186 lb (84.4 kg)   BMI 31.93 kg/m  Gen:  WD/WN, NAD Head: [place]/AT, No temporalis wasting. Ear/Nose/Throat: Hearing grossly intact, nares w/o erythema or drainage, trachea midline Eyes: Conjunctiva clear. Sclera non-icteric Neck: Supple.  No JVD.    Pulmonary:  Good air movement, no use of accessory muscles.  Cardiac: RRR, normal S1, S2 Vascular: Excellent thrill was present and right brachiocephalic AV fistula Vessel Right Left  Radial Palpable Palpable                                   Gastrointestinal: soft, non-tender/non-distended. No guarding/reflex.  Musculoskeletal: M/S 5/5 throughout.  No deformity or atrophy.  Neurologic: Sensation grossly intact in extremities.  Symmetrical.  Speech is fluent.  Psychiatric: Judgment intact, Mood & affect appropriate for pt's clinical situation. Dermatologic: No rashes or ulcers noted.  No cellulitis or open wounds. Lymph : No Cervical, Axillary, or Inguinal lymphadenopathy.      Labs No results found for this or any previous visit (from the past 2160 hour(s)).  Radiology No results found.   Assessment/Plan  History of renal transplant Currently working reasonably well. His not using her fistula, but she was high risk with her transplant and we need to keep the AV fistula available for backup if she needs it in the future.  Dyslipidemia lipid control important in reducing the progression of atherosclerotic disease. Continue statin therapy   Complication of vascular access for dialysis Her duplex today shows stable moderately elevated velocities at the cephalic vein subclavian vein confluence. The remainder of the AV fistula appears to be patent. This has required multiple interventions in the past. We will try to keep her AV fistula available since she was high risk with her transplant for rejection, and she will likely outlive the transplant. Recheck in 1 year.    Leotis Pain, MD  11/01/2016 2:02 PM    This note was created with Dragon medical transcription system.  Any errors from dictation are purely unintentional

## 2016-11-01 NOTE — Assessment & Plan Note (Signed)
Currently working reasonably well. His not using her fistula, but she was high risk with her transplant and we need to keep the AV fistula available for backup if she needs it in the future.

## 2016-11-04 DIAGNOSIS — E785 Hyperlipidemia, unspecified: Secondary | ICD-10-CM | POA: Diagnosis not present

## 2016-11-04 DIAGNOSIS — N2581 Secondary hyperparathyroidism of renal origin: Secondary | ICD-10-CM | POA: Diagnosis not present

## 2016-11-04 DIAGNOSIS — Z940 Kidney transplant status: Secondary | ICD-10-CM | POA: Diagnosis not present

## 2016-11-28 NOTE — Progress Notes (Signed)
[facility] Consultation      MRN# [id number] Megan Small 1951/08/28    Assessment and Plan: 66 yo female seen in follow-up visit for chronic bronchitis, history of kidney transplant.   Chronic bronchitis. --On Arnuity, currently using about 4 days per week.  --Will give sample of Incruz to use in place of arnuity, if works well she is asked to call for a prescription and use instead of arnuity, as inhaled steroids can slightly increase the risk of pneumonia, which is concerning given her current immunosuppression status post kidney transplant.  Lung nodule. -- 03/28/16 RML 31mm nodule. Repeat Ct chest in 6 months.   s/p renal transplant.  --On mycophenolate and prograf.   Obesity.  -Likely contributing to dyspnea, continue weight loss.  CC: Chief Complaint  Patient presents with  . 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 PFTs with mild restriction/obstruction, HRCT with air trapping, no ILD findings. Trial of ICS given significant response to BD on PFTs   Events since last clinic visit: Patient presents today for follow-up visit of her shortness of breath along with cough. Currently on Arnuity, with some clinical difference. She is continuing to lose weight, and a weight loss program.  Still with some mild DOE, but significantly improved, she notes her cough is equivocally improved, she has continued on Arnuity. She takes it on days when she knows she is going to be active. She has lost 38 pounds and has been walking more. She notes that the weight loss has helped and that the arnuity helps somewhat.     Review of Systems  Constitutional: Negative for fever.  Eyes: Negative for blurred vision.  Respiratory: Positive for shortness of breath. Negative for cough, sputum production and wheezing.   Cardiovascular:  Negative for chest pain.  Gastrointestinal: Negative for heartburn and nausea.  Genitourinary: Negative for dysuria.  Musculoskeletal: Negative for myalgias.  Skin: Negative for rash.  Neurological: Negative for dizziness and headaches.  Endo/Heme/Allergies: Does not bruise/bleed easily.  Psychiatric/Behavioral: Negative for depression.      Allergies:  Lisinopril and Tape  Physical Examination:  VS: BP 116/68 (BP Location: Left Arm, Cuff Size: Normal)   Pulse 67   Wt 181 lb (82.1 kg)   SpO2 98%   BMI 31.07 kg/m   General Appearance: No distress  HEENT: PERRLA, no ptosis, no other lesions noticed Pulmonary:normal breath sounds., diaphragmatic excursion normal.No wheezing, No rales   Cardiovascular:  Normal S1,S2.  No m/r/g.     Abdomen:Exam: Benign, Soft, non-tender, No masses  Skin:   warm, no rashes, no ecchymosis  Extremities: normal, no cyanosis, clubbing, warm with normal capillary refill.      Rad results: (The following images and results were reviewed by Dr. Stevenson Clinch on 11/28/2016). HRCT 03/28/16 EXAM: CT CHEST WITHOUT CONTRAST TECHNIQUE: Multidetector CT imaging of the chest was performed following the standard protocol without intravenous contrast. High resolution imaging of the lungs, as well as inspiratory and expiratory imaging, was performed. COMPARISON:  No priors. FINDINGS: Cardiovascular: Heart size is normal. There is no significant pericardial fluid, thickening or pericardial calcification. There is aortic atherosclerosis, as well as atherosclerosis of the great vessels of the mediastinum and the coronary arteries, including calcified atherosclerotic plaque in the right coronary artery. Mediastinum/Nodes: No pathologically enlarged mediastinal or hilar lymph nodes. Please  note that accurate exclusion of hilar adenopathy is limited on noncontrast CT scans. Small hiatal hernia. No axillary lymphadenopathy.  Lungs/Pleura: High-resolution images  demonstrate no significant regions of ground-glass attenuation, subpleural reticulation, traction bronchiectasis or frank honeycombing to indicate interstitial lung disease. There are some areas of mild linear scarring in the lung bases bilaterally. Inspiratory and expiratory imaging demonstrates mild air trapping, indicative of mild small airways disease. No acute consolidative airspace disease. No pleural effusions. 3 mm pulmonary nodule in the right middle lobe (image 79 of series 3) is highly nonspecific. No larger more suspicious appearing pulmonary nodules or masses are otherwise noted.  Upper Abdomen: Innumerable low-attenuation lesions scattered throughout the liver, incompletely characterized on today's noncontrast CT examination, but similar to prior study, likely multiple cysts, with the largest of these measuring up to 4.4 cm in segments 7 and 8. Superior aspects of the kidneys are incompletely visualized, but also demonstrate innumerable predominantly low-attenuation lesions. There also several high attenuation lesions in the kidneys bilaterally. These are all incompletely characterized on today's noncontrast CT examination, but also likely represent a combination of simple cysts and proteinaceous/hemorrhagic cysts, with the largest hemorrhagic cyst in the upper pole of the right kidney measuring 2.6 cm in diameter. Aortic atherosclerosis. Musculoskeletal: There are no aggressive appearing lytic or blastic lesions noted in the visualized portions of the skeleton.  IMPRESSION: 1. No findings to suggest interstitial lung disease. 2. Tiny 3 mm right middle lobe pulmonary nodules nonspecific and statistically likely benign. No follow-up needed if patient is low-risk. Non-contrast chest CT can be considered in 12 months if patient is high-risk. This recommendation follows the consensus statement: Guidelines for Management of Incidental Pulmonary Nodules Detected on CT  Images:From the Fleischner Society 2017; published online before print (10.1148/radiol.4174081448). 3. Aortic atherosclerosis, in addition to right coronary artery disease. Please note that although the presence of coronary artery calcium documents the presence of coronary artery disease, the severity of this disease and any potential stenosis cannot be assessed on this non-gated CT examination. Assessment for potential risk factor modification, dietary therapy or pharmacologic therapy may be warranted, if clinically indicated. 4. 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       Updated Medication List Outpatient Encounter Prescriptions as of 11/29/2016  Medication Sig  . acetaminophen (TYLENOL) 500 MG tablet Take 500 mg by mouth every 6 (six) hours as needed for mild pain.  Marland Kitchen aspirin 81 MG tablet Take 81 mg by mouth daily.  . calcium carbonate (TUMS EX) 750 MG chewable tablet Chew 1 tablet by mouth 2 (two) times daily as needed.   . cetirizine (ZYRTEC) 10 MG chewable tablet Chew 10 mg by mouth daily.  . Cholecalciferol (VITAMIN D-3) 1000 units CAPS Take 2 capsules by mouth 2 (two) times daily.   . clonazePAM (KLONOPIN) 1 MG tablet Take 1 mg by mouth 2 (two) times daily as needed for anxiety.  Marland Kitchen estradiol (ESTRACE) 0.5 MG tablet Take 0.5 mg by mouth daily.  . Fluticasone Furoate (ARNUITY ELLIPTA) 100 MCG/ACT AEPB Inhale 1 puff into the lungs daily.  Marland Kitchen HYDROcodone-acetaminophen (NORCO/VICODIN) 5-325 MG tablet Take 1 tablet by mouth every 6 (six) hours as needed for moderate pain.  . magnesium oxide (MAG-OX) 400 MG tablet Take 400 mg by mouth daily.  . Melatonin 5 MG CAPS Take 1 capsule by mouth daily as needed.  . Multiple Vitamins-Minerals (CENTRUM SILVER PO) Take 1 capsule by mouth daily.  . mycophenolate (MYFORTIC) 180  MG EC tablet Take 180 mg by mouth 4 (four) times daily.  Marland Kitchen NIFEdipine (PROCARDIA XL/ADALAT-CC) 60 MG 24 hr  tablet Take 60 mg by mouth daily.  Marland Kitchen omeprazole (PRILOSEC) 20 MG capsule Take 20 mg by mouth daily.  . predniSONE (DELTASONE) 5 MG tablet Take 5 mg by mouth daily with breakfast.  . rosuvastatin (CRESTOR) 5 MG tablet Take 5 mg by mouth daily at 6 PM.   . Sulfamethoxazole-Trimethoprim (SULFAMETHOXAZOLE-TMP DS PO) Take 1 capsule by mouth 3 (three) times a week.   . tacrolimus (PROGRAF) 0.5 MG capsule Take 0.5 mg by mouth daily.  . tacrolimus (PROGRAF) 1 MG capsule Take 1 mg by mouth 2 (two) times daily.   No facility-administered encounter medications on file as of 11/29/2016.     Orders for this visit: No orders of the defined types were placed in this encounter.   Thank  you for the visitation and for allowing  [facility] to assist in the care of your patient. Our recommendations are noted above.  Please contact us if we can be of further service.  Marda Stalker MD [facility] Office Number: [phone]

## 2016-11-29 ENCOUNTER — Ambulatory Visit (INDEPENDENT_AMBULATORY_CARE_PROVIDER_SITE_OTHER): Payer: 59 | Admitting: Internal Medicine

## 2016-11-29 ENCOUNTER — Encounter: Payer: Self-pay | Admitting: Internal Medicine

## 2016-11-29 VITALS — BP 116/68 | HR 67 | Wt 181.0 lb

## 2016-11-29 DIAGNOSIS — R911 Solitary pulmonary nodule: Secondary | ICD-10-CM

## 2016-11-29 DIAGNOSIS — J411 Mucopurulent chronic bronchitis: Secondary | ICD-10-CM | POA: Diagnosis not present

## 2016-11-29 DIAGNOSIS — R05 Cough: Secondary | ICD-10-CM

## 2016-11-29 DIAGNOSIS — R053 Chronic cough: Secondary | ICD-10-CM

## 2016-11-29 NOTE — Addendum Note (Signed)
Addended by: Oscar La R on: 11/29/2016 11:09 AM   Modules accepted: Orders

## 2016-11-29 NOTE — Patient Instructions (Addendum)
--  Will give sample of Incruz to use in place of arnuity, if works well  call for a prescription and use instead of arnuity. Use incruz one puff daily as needed.   --Repeat CT chest in 6 months without contrast re: lung nodule.

## 2016-12-02 DIAGNOSIS — Z940 Kidney transplant status: Secondary | ICD-10-CM | POA: Diagnosis not present

## 2016-12-13 ENCOUNTER — Telehealth: Payer: Self-pay | Admitting: *Deleted

## 2016-12-13 NOTE — Telephone Encounter (Signed)
Pt Megan Small stating that she didn't feel the Incruse worked as well so she is going to continue the [place].   FYI.

## 2016-12-16 DIAGNOSIS — Z940 Kidney transplant status: Secondary | ICD-10-CM | POA: Diagnosis not present

## 2016-12-19 DIAGNOSIS — M940 Chondrocostal junction syndrome [Tietze]: Secondary | ICD-10-CM | POA: Diagnosis not present

## 2016-12-30 DIAGNOSIS — Z940 Kidney transplant status: Secondary | ICD-10-CM | POA: Diagnosis not present

## 2017-01-02 ENCOUNTER — Other Ambulatory Visit: Payer: Self-pay | Admitting: *Deleted

## 2017-01-02 MED ORDER — FLUTICASONE FUROATE 100 MCG/ACT IN AEPB: 1.0000 | INHALATION_SPRAY | Freq: Every day | RESPIRATORY_TRACT | 5 refills | Status: DC

## 2017-01-06 DIAGNOSIS — E785 Hyperlipidemia, unspecified: Secondary | ICD-10-CM | POA: Diagnosis not present

## 2017-01-06 DIAGNOSIS — I770 Arteriovenous fistula, acquired: Secondary | ICD-10-CM | POA: Diagnosis not present

## 2017-01-06 DIAGNOSIS — J984 Other disorders of lung: Secondary | ICD-10-CM | POA: Diagnosis not present

## 2017-01-06 DIAGNOSIS — Q612 Polycystic kidney, adult type: Secondary | ICD-10-CM | POA: Diagnosis not present

## 2017-01-06 DIAGNOSIS — Z940 Kidney transplant status: Secondary | ICD-10-CM | POA: Diagnosis not present

## 2017-01-06 DIAGNOSIS — Z6832 Body mass index (BMI) 32.0-32.9, adult: Secondary | ICD-10-CM | POA: Diagnosis not present

## 2017-01-06 DIAGNOSIS — I10 Essential (primary) hypertension: Secondary | ICD-10-CM | POA: Diagnosis not present

## 2017-01-19 DIAGNOSIS — Z940 Kidney transplant status: Secondary | ICD-10-CM | POA: Diagnosis not present

## 2017-01-24 DIAGNOSIS — Z940 Kidney transplant status: Secondary | ICD-10-CM | POA: Diagnosis not present

## 2017-02-15 DIAGNOSIS — Z940 Kidney transplant status: Secondary | ICD-10-CM | POA: Diagnosis not present

## 2017-02-20 DIAGNOSIS — Z85828 Personal history of other malignant neoplasm of skin: Secondary | ICD-10-CM | POA: Diagnosis not present

## 2017-02-20 DIAGNOSIS — C44729 Squamous cell carcinoma of skin of left lower limb, including hip: Secondary | ICD-10-CM | POA: Diagnosis not present

## 2017-02-20 DIAGNOSIS — L821 Other seborrheic keratosis: Secondary | ICD-10-CM | POA: Diagnosis not present

## 2017-02-20 DIAGNOSIS — C44722 Squamous cell carcinoma of skin of right lower limb, including hip: Secondary | ICD-10-CM | POA: Diagnosis not present

## 2017-02-20 DIAGNOSIS — L570 Actinic keratosis: Secondary | ICD-10-CM | POA: Diagnosis not present

## 2017-02-20 DIAGNOSIS — D692 Other nonthrombocytopenic purpura: Secondary | ICD-10-CM | POA: Diagnosis not present

## 2017-02-20 DIAGNOSIS — D2262 Melanocytic nevi of left upper limb, including shoulder: Secondary | ICD-10-CM | POA: Diagnosis not present

## 2017-02-20 DIAGNOSIS — D225 Melanocytic nevi of trunk: Secondary | ICD-10-CM | POA: Diagnosis not present

## 2017-02-20 DIAGNOSIS — L700 Acne vulgaris: Secondary | ICD-10-CM | POA: Diagnosis not present

## 2017-02-21 DIAGNOSIS — Z940 Kidney transplant status: Secondary | ICD-10-CM | POA: Diagnosis not present

## 2017-03-21 DIAGNOSIS — Z940 Kidney transplant status: Secondary | ICD-10-CM | POA: Diagnosis not present

## 2017-04-07 DIAGNOSIS — Z6827 Body mass index (BMI) 27.0-27.9, adult: Secondary | ICD-10-CM | POA: Diagnosis not present

## 2017-04-07 DIAGNOSIS — R110 Nausea: Secondary | ICD-10-CM | POA: Diagnosis not present

## 2017-04-07 DIAGNOSIS — I10 Essential (primary) hypertension: Secondary | ICD-10-CM | POA: Diagnosis not present

## 2017-04-07 DIAGNOSIS — Q612 Polycystic kidney, adult type: Secondary | ICD-10-CM | POA: Diagnosis not present

## 2017-04-07 DIAGNOSIS — I770 Arteriovenous fistula, acquired: Secondary | ICD-10-CM | POA: Diagnosis not present

## 2017-04-07 DIAGNOSIS — E785 Hyperlipidemia, unspecified: Secondary | ICD-10-CM | POA: Diagnosis not present

## 2017-04-07 DIAGNOSIS — J984 Other disorders of lung: Secondary | ICD-10-CM | POA: Diagnosis not present

## 2017-04-07 DIAGNOSIS — Z940 Kidney transplant status: Secondary | ICD-10-CM | POA: Diagnosis not present

## 2017-04-18 DIAGNOSIS — E785 Hyperlipidemia, unspecified: Secondary | ICD-10-CM | POA: Diagnosis not present

## 2017-04-18 DIAGNOSIS — R7309 Other abnormal glucose: Secondary | ICD-10-CM | POA: Diagnosis not present

## 2017-04-18 DIAGNOSIS — Z940 Kidney transplant status: Secondary | ICD-10-CM | POA: Diagnosis not present

## 2017-04-18 DIAGNOSIS — N2581 Secondary hyperparathyroidism of renal origin: Secondary | ICD-10-CM | POA: Diagnosis not present

## 2017-05-11 ENCOUNTER — Other Ambulatory Visit: Payer: Self-pay | Admitting: Obstetrics & Gynecology

## 2017-05-11 DIAGNOSIS — N644 Mastodynia: Secondary | ICD-10-CM

## 2017-05-23 ENCOUNTER — Ambulatory Visit
Admission: RE | Admit: 2017-05-23 | Discharge: 2017-05-23 | Disposition: A | Discharge status: Home / Self Care | Payer: 59 | Source: Ambulatory Visit | Attending: Internal Medicine | Admitting: Internal Medicine

## 2017-05-23 DIAGNOSIS — I700 Atherosclerosis of aorta: Secondary | ICD-10-CM | POA: Insufficient documentation

## 2017-05-23 DIAGNOSIS — Q613 Polycystic kidney, unspecified: Secondary | ICD-10-CM | POA: Insufficient documentation

## 2017-05-23 DIAGNOSIS — R911 Solitary pulmonary nodule: Secondary | ICD-10-CM | POA: Insufficient documentation

## 2017-05-23 DIAGNOSIS — Z940 Kidney transplant status: Secondary | ICD-10-CM | POA: Diagnosis not present

## 2017-05-23 DIAGNOSIS — M549 Dorsalgia, unspecified: Secondary | ICD-10-CM | POA: Diagnosis not present

## 2017-05-25 NOTE — Progress Notes (Signed)
[facility] Consultation      MRN# [id number] Megan Small 29-Dec-1950    Assessment and Plan: 66 yo female seen in follow-up visit for chronic bronchitis, history of kidney transplant with lung nodule.    Chronic bronchitis with dyspnea on exertion.  --On Arnuity, currently using every day, and notes that it helps with breathing and cough, wil continue.   Lung nodule. -- 03/28/16 RML 31mm nodule. Repeat Ct chest CT chest/18/18 negative for nodule.   s/p renal transplant.  --On mycophenolate and prograf.   Obesity.  -Likely contributing to dyspnea, continue weight loss.  CC: Chief Complaint  Patient presents with  . Cough    6 mos f/u cough with CT chest. Per patient cough resolved.      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 PFTs with mild restriction/obstruction, HRCT with air trapping, no ILD findings. Trial of ICS given significant response to BD on PFTs   Events since last clinic visit: Patient presents today for follow-up visit of her shortness of breath along with cough. She has lost 61 pounds, she continues on arnuity inhaler and feels that it helps. Her breathing is better now, the cough is also present.    **Images personally reviewed, CT chest/18/18; lungs are normal, no nodules noted.    Review of Systems  Constitutional: Negative for fever.  Eyes: Negative for blurred vision.  Respiratory: Positive for shortness of breath. Negative for cough, sputum production and wheezing.   Cardiovascular: Negative for chest pain.  Gastrointestinal: Negative for heartburn and nausea.  Genitourinary: Negative for dysuria.  Musculoskeletal: Negative for myalgias.  Skin: Negative for rash.  Neurological: Negative for dizziness and headaches.  Endo/Heme/Allergies: Does not bruise/bleed easily.  Psychiatric/Behavioral: Negative for depression.      Allergies:  Lisinopril  and Tape  Physical Examination:  VS: There were no vitals taken for this visit.  General Appearance: No distress  HEENT: PERRLA, no ptosis, no other lesions noticed Pulmonary:normal breath sounds., diaphragmatic excursion normal.No wheezing, No rales   Cardiovascular:  Normal S1,S2.  No m/r/g.     Abdomen:Exam: Benign, Soft, non-tender, No masses  Skin:   warm, no rashes, no ecchymosis  Extremities: normal, no cyanosis, clubbing, warm with normal capillary refill.      Rad results: (The following images and results were reviewed by Dr. Stevenson Clinch on 05/25/2017). HRCT 03/28/16 EXAM: CT CHEST WITHOUT CONTRAST TECHNIQUE: Multidetector CT imaging of the chest was performed following the standard protocol without intravenous contrast. High resolution imaging of the lungs, as well as inspiratory and expiratory imaging, was performed. COMPARISON:  No priors. FINDINGS: Cardiovascular: Heart size is normal. There is no significant pericardial fluid, thickening or pericardial calcification. There is aortic atherosclerosis, as well as atherosclerosis of the great vessels of the mediastinum and the coronary arteries, including calcified atherosclerotic plaque in the right coronary artery. Mediastinum/Nodes: No pathologically enlarged mediastinal or hilar lymph nodes. Please note that accurate exclusion of hilar adenopathy is limited on noncontrast CT scans. Small hiatal hernia. No axillary lymphadenopathy.  Lungs/Pleura: High-resolution images demonstrate no significant regions of ground-glass attenuation, subpleural reticulation, traction bronchiectasis or frank honeycombing to indicate interstitial lung disease. There are some areas of mild linear scarring in the lung bases bilaterally. Inspiratory and expiratory imaging demonstrates mild air trapping, indicative of mild small airways disease. No acute consolidative airspace disease. No pleural effusions. 3 mm pulmonary nodule in the right  middle lobe (  image 79 of series 3) is highly nonspecific. No larger more suspicious appearing pulmonary nodules or masses are otherwise noted.  Upper Abdomen: Innumerable low-attenuation lesions scattered throughout the liver, incompletely characterized on today's noncontrast CT examination, but similar to prior study, likely multiple cysts, with the largest of these measuring up to 4.4 cm in segments 7 and 8. Superior aspects of the kidneys are incompletely visualized, but also demonstrate innumerable predominantly low-attenuation lesions. There also several high attenuation lesions in the kidneys bilaterally. These are all incompletely characterized on today's noncontrast CT examination, but also likely represent a combination of simple cysts and proteinaceous/hemorrhagic cysts, with the largest hemorrhagic cyst in the upper pole of the right kidney measuring 2.6 cm in diameter. Aortic atherosclerosis. Musculoskeletal: There are no aggressive appearing lytic or blastic lesions noted in the visualized portions of the skeleton.  IMPRESSION: 1. No findings to suggest interstitial lung disease. 2. Tiny 3 mm right middle lobe pulmonary nodules nonspecific and statistically likely benign. No follow-up needed if patient is low-risk. Non-contrast chest CT can be considered in 12 months if patient is high-risk. This recommendation follows the consensus statement: Guidelines for Management of Incidental Pulmonary Nodules Detected on CT Images:From the Fleischner Society 2017; published online before print (10.1148/radiol.[id number]). 3. Aortic atherosclerosis, in addition to right coronary artery disease. Please note that although the presence of coronary artery calcium documents the presence of coronary artery disease, the severity of this disease and any potential stenosis cannot be assessed on this non-gated CT examination. Assessment for potential risk factor modification, dietary  therapy or pharmacologic therapy may be warranted, if clinically indicated. 4. 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       Updated Medication List Outpatient Encounter Prescriptions as of 05/30/2017  Medication Sig  . acetaminophen (TYLENOL) 500 MG tablet Take 500 mg by mouth every 6 (six) hours as needed for mild pain.  Marland Kitchen aspirin 81 MG tablet Take 81 mg by mouth daily.  . calcium carbonate (TUMS EX) 750 MG chewable tablet Chew 1 tablet by mouth 2 (two) times daily as needed.   . cetirizine (ZYRTEC) 10 MG chewable tablet Chew 10 mg by mouth daily.  . Cholecalciferol (VITAMIN D-3) 1000 units CAPS Take 2 capsules by mouth 2 (two) times daily.   . clonazePAM (KLONOPIN) 1 MG tablet Take 1 mg by mouth 2 (two) times daily as needed for anxiety.  Marland Kitchen estradiol (ESTRACE) 0.5 MG tablet Take 0.5 mg by mouth daily.  . Fluticasone Furoate (ARNUITY ELLIPTA) 100 MCG/ACT AEPB Inhale 1 puff into the lungs daily.  Marland Kitchen HYDROcodone-acetaminophen (NORCO/VICODIN) 5-325 MG tablet Take 1 tablet by mouth every 6 (six) hours as needed for moderate pain.  . magnesium oxide (MAG-OX) 400 MG tablet Take 400 mg by mouth daily.  . Melatonin 5 MG CAPS Take 1 capsule by mouth daily as needed.  . Multiple Vitamins-Minerals (CENTRUM SILVER PO) Take 1 capsule by mouth daily.  . mycophenolate (MYFORTIC) 180 MG EC tablet Take 180 mg by mouth 4 (four) times daily.  Marland Kitchen NIFEdipine (PROCARDIA XL/ADALAT-CC) 60 MG 24 hr tablet Take 60 mg by mouth daily.  Marland Kitchen omeprazole (PRILOSEC) 20 MG capsule Take 20 mg by mouth daily.  . predniSONE (DELTASONE) 5 MG tablet Take 5 mg by mouth daily with breakfast.  . rosuvastatin (CRESTOR) 5 MG tablet Take 5 mg by mouth daily at 6 PM.   . Sulfamethoxazole-Trimethoprim (SULFAMETHOXAZOLE-TMP DS PO) Take 1 capsule by mouth 3 (three) times  a week.   . tacrolimus (PROGRAF) 0.5 MG capsule Take 0.5 mg by mouth daily.  . tacrolimus (PROGRAF) 1 MG  capsule Take 1 mg by mouth 2 (two) times daily.   No facility-administered encounter medications on file as of 05/30/2017.     Orders for this visit: No orders of the defined types were placed in this encounter.   Thank  you for the visitation and for allowing  [facility] to assist in the care of your patient. Our recommendations are noted above.  Please contact us if we can be of further service.  Marda Stalker MD [facility] Office Number: [phone]

## 2017-05-30 ENCOUNTER — Encounter: Payer: Self-pay | Admitting: Internal Medicine

## 2017-05-30 ENCOUNTER — Ambulatory Visit (INDEPENDENT_AMBULATORY_CARE_PROVIDER_SITE_OTHER): Payer: 59 | Admitting: Internal Medicine

## 2017-05-30 VITALS — BP 132/68 | HR 63 | Resp 16 | Ht 64.0 in | Wt 157.0 lb

## 2017-05-30 DIAGNOSIS — R05 Cough: Secondary | ICD-10-CM | POA: Diagnosis not present

## 2017-05-30 DIAGNOSIS — R0609 Other forms of dyspnea: Secondary | ICD-10-CM

## 2017-05-30 DIAGNOSIS — R053 Chronic cough: Secondary | ICD-10-CM

## 2017-05-30 NOTE — Patient Instructions (Signed)
Continue using arnuity inhaler daily.

## 2017-06-20 DIAGNOSIS — Z79899 Other long term (current) drug therapy: Secondary | ICD-10-CM | POA: Diagnosis not present

## 2017-06-20 DIAGNOSIS — I10 Essential (primary) hypertension: Secondary | ICD-10-CM | POA: Diagnosis not present

## 2017-06-20 DIAGNOSIS — Z940 Kidney transplant status: Secondary | ICD-10-CM | POA: Diagnosis not present

## 2017-06-20 DIAGNOSIS — Z4822 Encounter for aftercare following kidney transplant: Secondary | ICD-10-CM | POA: Diagnosis not present

## 2017-06-20 DIAGNOSIS — Z85828 Personal history of other malignant neoplasm of skin: Secondary | ICD-10-CM | POA: Diagnosis not present

## 2017-06-20 DIAGNOSIS — B338 Other specified viral diseases: Secondary | ICD-10-CM | POA: Diagnosis not present

## 2017-06-20 DIAGNOSIS — D8989 Other specified disorders involving the immune mechanism, not elsewhere classified: Secondary | ICD-10-CM | POA: Diagnosis not present

## 2017-06-20 DIAGNOSIS — Z792 Long term (current) use of antibiotics: Secondary | ICD-10-CM | POA: Diagnosis not present

## 2017-06-20 DIAGNOSIS — Z7952 Long term (current) use of systemic steroids: Secondary | ICD-10-CM | POA: Diagnosis not present

## 2017-07-03 DIAGNOSIS — Z940 Kidney transplant status: Secondary | ICD-10-CM | POA: Diagnosis not present

## 2017-07-04 DIAGNOSIS — L570 Actinic keratosis: Secondary | ICD-10-CM | POA: Diagnosis not present

## 2017-07-04 DIAGNOSIS — L080 Pyoderma: Secondary | ICD-10-CM | POA: Diagnosis not present

## 2017-07-04 DIAGNOSIS — L821 Other seborrheic keratosis: Secondary | ICD-10-CM | POA: Diagnosis not present

## 2017-07-04 DIAGNOSIS — Z85828 Personal history of other malignant neoplasm of skin: Secondary | ICD-10-CM | POA: Diagnosis not present

## 2017-07-04 DIAGNOSIS — D485 Neoplasm of uncertain behavior of skin: Secondary | ICD-10-CM | POA: Diagnosis not present

## 2017-07-07 DIAGNOSIS — Z6827 Body mass index (BMI) 27.0-27.9, adult: Secondary | ICD-10-CM | POA: Diagnosis not present

## 2017-07-07 DIAGNOSIS — Z940 Kidney transplant status: Secondary | ICD-10-CM | POA: Diagnosis not present

## 2017-07-07 DIAGNOSIS — I10 Essential (primary) hypertension: Secondary | ICD-10-CM | POA: Diagnosis not present

## 2017-07-07 DIAGNOSIS — J984 Other disorders of lung: Secondary | ICD-10-CM | POA: Diagnosis not present

## 2017-07-07 DIAGNOSIS — E785 Hyperlipidemia, unspecified: Secondary | ICD-10-CM | POA: Diagnosis not present

## 2017-07-07 DIAGNOSIS — Q612 Polycystic kidney, adult type: Secondary | ICD-10-CM | POA: Diagnosis not present

## 2017-07-07 DIAGNOSIS — Z23 Encounter for immunization: Secondary | ICD-10-CM | POA: Diagnosis not present

## 2017-07-07 DIAGNOSIS — I770 Arteriovenous fistula, acquired: Secondary | ICD-10-CM | POA: Diagnosis not present

## 2017-07-19 DIAGNOSIS — Z940 Kidney transplant status: Secondary | ICD-10-CM | POA: Diagnosis not present

## 2017-07-24 IMAGING — NM NM PULMONARY VENT & PERF
2 series · 16 of 16 positions shown · non-contrast
Comparison: None.

CLINICAL DATA: Acute onset of shortness of breath and fatigue on
exertion. Initial encounter.

EXAM:
NUCLEAR MEDICINE VENTILATION - PERFUSION LUNG SCAN
TECHNIQUE: Ventilation images were obtained in multiple projections using
inhaled aerosol Fc-OOm DTPA. Perfusion images were obtained in
multiple projections after intravenous injection of Fc-OOm MAA.
RADIOPHARMACEUTICALS:  30.682 mCi 2echnetium-BBm DTPA and 4.014 mCi
2echnetium-BBm MAA IV

[Series 1000: lung perfusion · 1.95mm/px · 4 acquisitions, 8 frames shown]
[im 1/4]
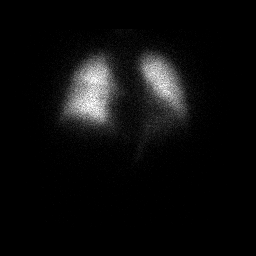
[im 1/4]
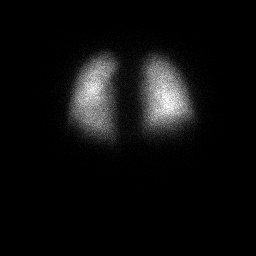
[im 2/4]
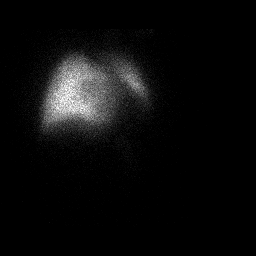
[im 2/4]
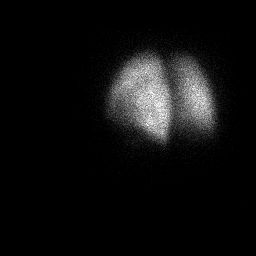
[im 3/4]
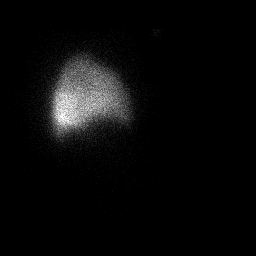
[im 3/4]
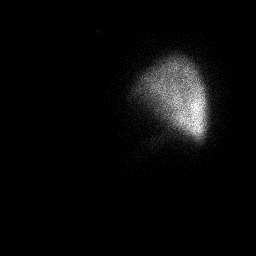
[im 4/4]
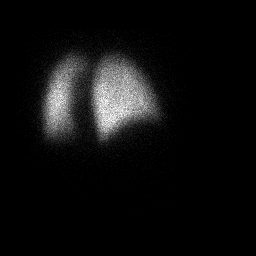
[im 4/4]
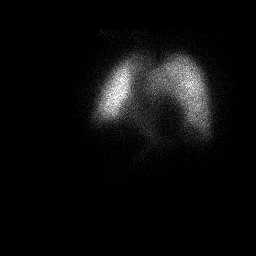

[Series 1000: lung ventilation · 3.90mm/px · 4 acquisitions, 8 frames shown]
[im 1/4]
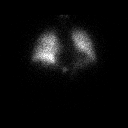
[im 1/4]
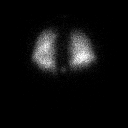
[im 2/4]
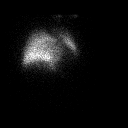
[im 2/4]
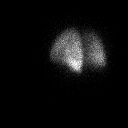
[im 3/4]
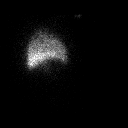
[im 3/4]
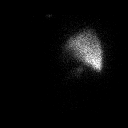
[im 4/4]
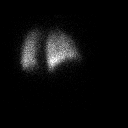
[im 4/4]
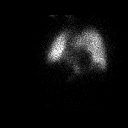

[16 of 16 positions shown; findings below may reference images not displayed]

FINDINGS: Ventilation: No focal ventilation defect. Ventilation images
demonstrate normal accumulation of activity within both lungs.

Perfusion: No wedge shaped peripheral perfusion defects to suggest
acute pulmonary embolism.
IMPRESSION: Normal V/Q scan.

## 2017-07-24 IMAGING — CR DG CHEST 2V
2 series · 2 of 2 positions shown · non-contrast
Comparison: Chest radiograph from 09/14/2008

CLINICAL DATA: Acute onset of elevated D-dimer.  Initial encounter.

EXAM:
CHEST  2 VIEW

[chest pa]
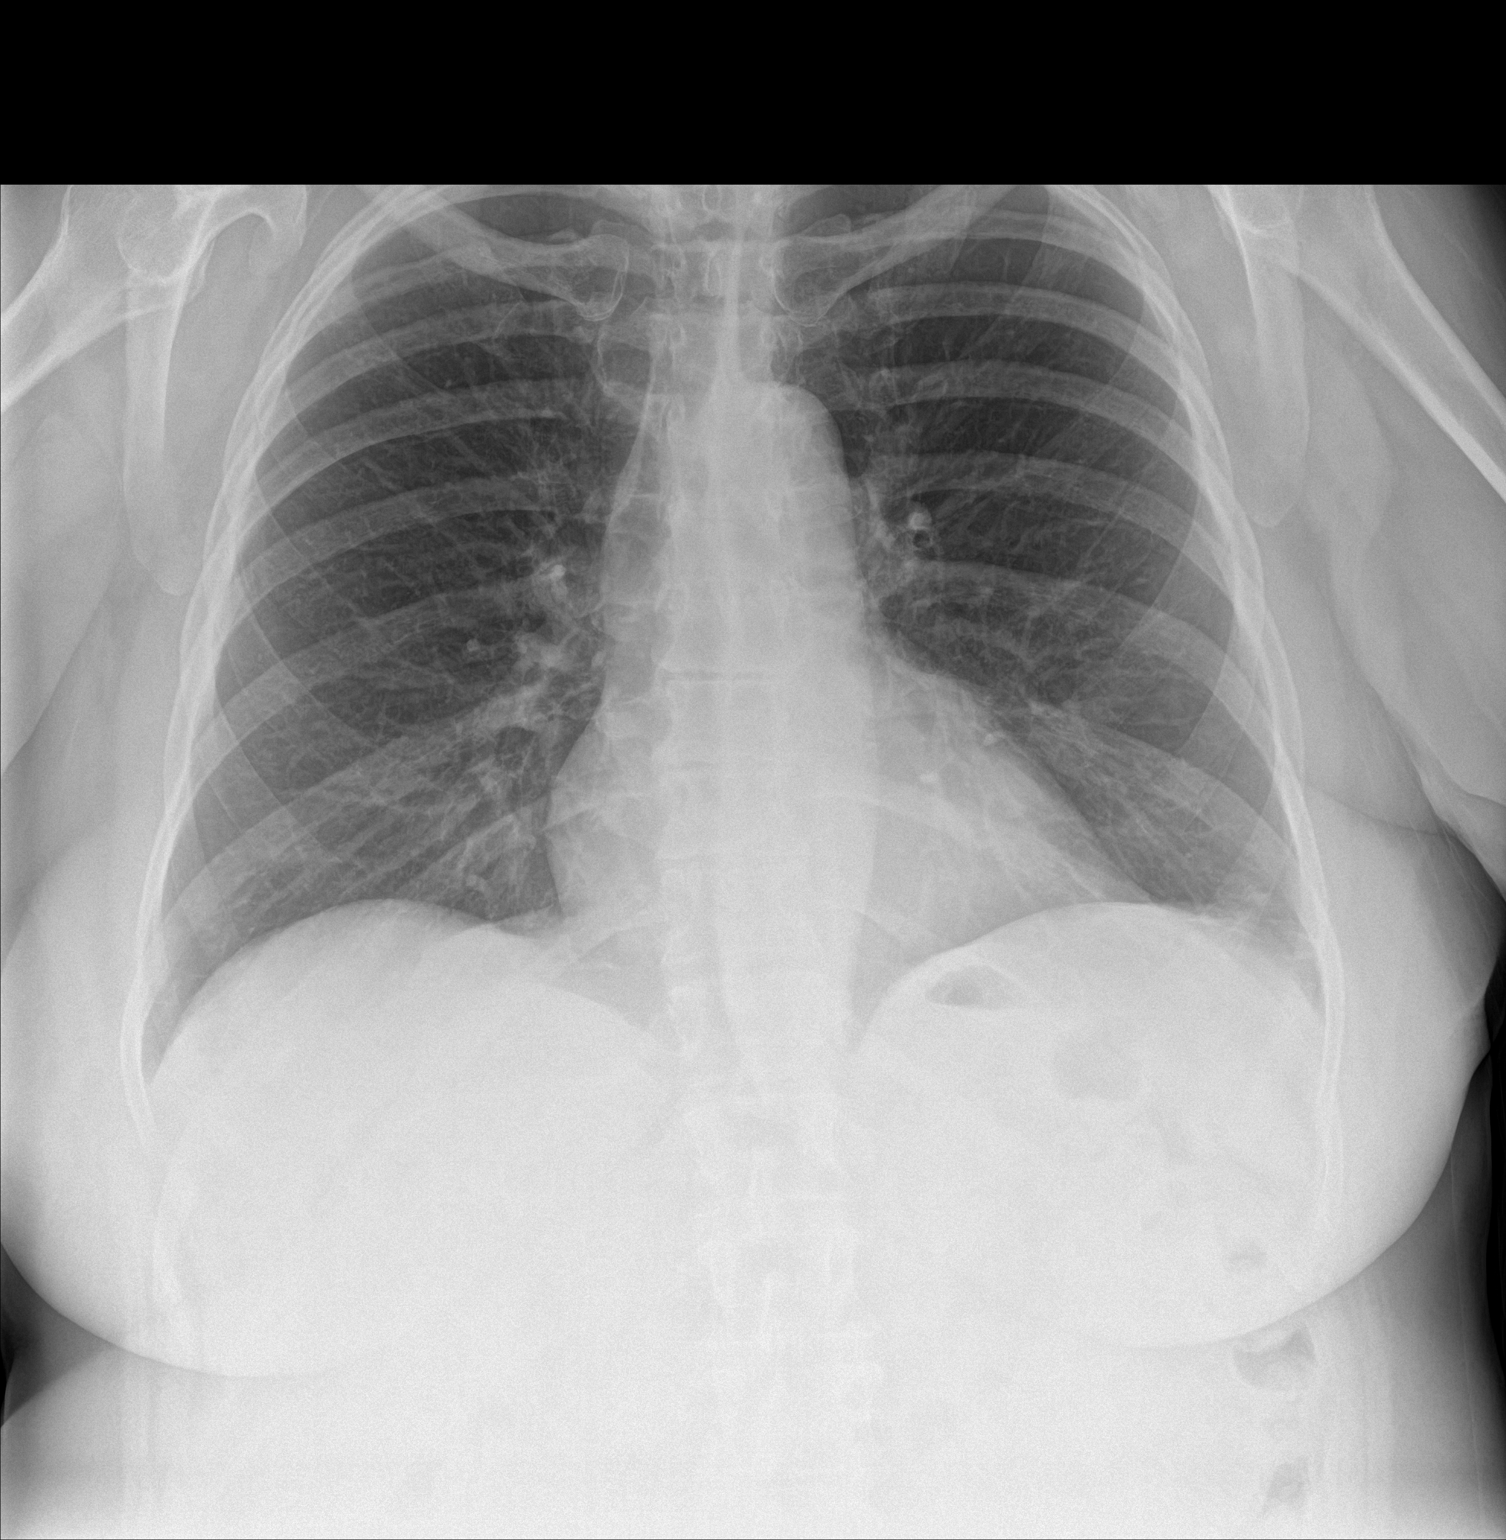

[chest lat]
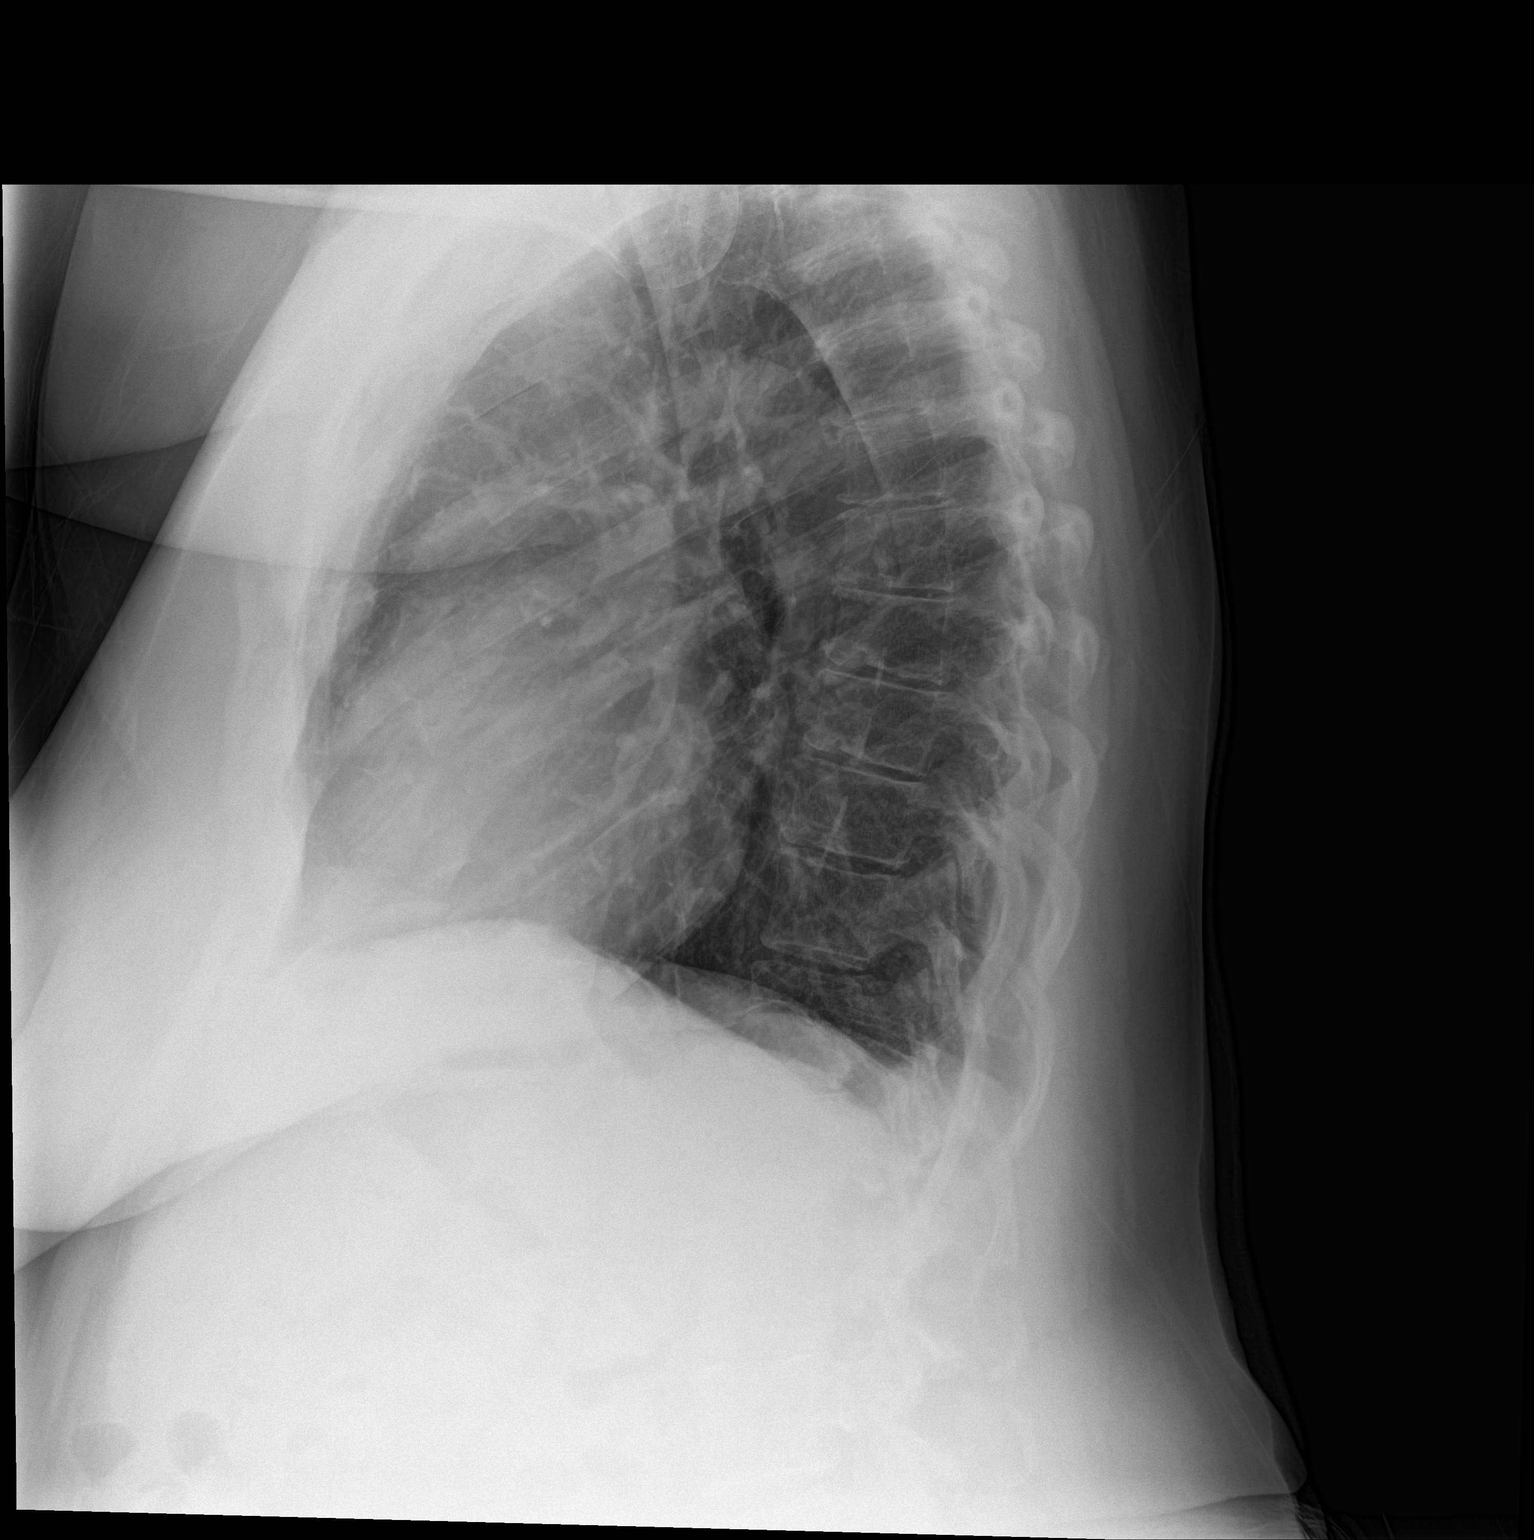

[2 of 2 positions shown; findings below may reference images not displayed]

FINDINGS: The lungs are well-aerated. Minimal left basilar atelectasis is
noted. There is no evidence of pleural effusion or pneumothorax.

The heart is normal in size; the mediastinal contour is within
normal limits. No acute osseous abnormalities are seen.
IMPRESSION: Minimal left basilar atelectasis noted.  Lungs otherwise clear.

## 2017-08-21 ENCOUNTER — Other Ambulatory Visit: Payer: Self-pay | Admitting: Obstetrics & Gynecology

## 2017-08-21 DIAGNOSIS — Z139 Encounter for screening, unspecified: Secondary | ICD-10-CM

## 2017-08-22 DIAGNOSIS — L905 Scar conditions and fibrosis of skin: Secondary | ICD-10-CM | POA: Diagnosis not present

## 2017-08-22 DIAGNOSIS — L821 Other seborrheic keratosis: Secondary | ICD-10-CM | POA: Diagnosis not present

## 2017-08-22 DIAGNOSIS — D2222 Melanocytic nevi of left ear and external auricular canal: Secondary | ICD-10-CM | POA: Diagnosis not present

## 2017-08-22 DIAGNOSIS — D2272 Melanocytic nevi of left lower limb, including hip: Secondary | ICD-10-CM | POA: Diagnosis not present

## 2017-08-22 DIAGNOSIS — Z940 Kidney transplant status: Secondary | ICD-10-CM | POA: Diagnosis not present

## 2017-08-22 DIAGNOSIS — D225 Melanocytic nevi of trunk: Secondary | ICD-10-CM | POA: Diagnosis not present

## 2017-08-22 DIAGNOSIS — Z85828 Personal history of other malignant neoplasm of skin: Secondary | ICD-10-CM | POA: Diagnosis not present

## 2017-08-22 DIAGNOSIS — D2271 Melanocytic nevi of right lower limb, including hip: Secondary | ICD-10-CM | POA: Diagnosis not present

## 2017-08-22 DIAGNOSIS — D2262 Melanocytic nevi of left upper limb, including shoulder: Secondary | ICD-10-CM | POA: Diagnosis not present

## 2017-08-31 DIAGNOSIS — Z79899 Other long term (current) drug therapy: Secondary | ICD-10-CM | POA: Diagnosis not present

## 2017-09-12 ENCOUNTER — Other Ambulatory Visit: Payer: Self-pay | Admitting: Internal Medicine

## 2017-09-12 DIAGNOSIS — Z01419 Encounter for gynecological examination (general) (routine) without abnormal findings: Secondary | ICD-10-CM | POA: Diagnosis not present

## 2017-09-12 DIAGNOSIS — Z6826 Body mass index (BMI) 26.0-26.9, adult: Secondary | ICD-10-CM | POA: Diagnosis not present

## 2017-09-19 ENCOUNTER — Ambulatory Visit
Admission: RE | Admit: 2017-09-19 | Discharge: 2017-09-19 | Disposition: A | Discharge status: Home / Self Care | Payer: 59 | Source: Ambulatory Visit | Attending: Obstetrics & Gynecology | Admitting: Obstetrics & Gynecology

## 2017-09-19 DIAGNOSIS — Z1231 Encounter for screening mammogram for malignant neoplasm of breast: Secondary | ICD-10-CM | POA: Diagnosis not present

## 2017-09-19 DIAGNOSIS — Z139 Encounter for screening, unspecified: Secondary | ICD-10-CM

## 2017-09-26 DIAGNOSIS — Z940 Kidney transplant status: Secondary | ICD-10-CM | POA: Diagnosis not present

## 2017-10-12 ENCOUNTER — Other Ambulatory Visit: Payer: Self-pay | Admitting: Internal Medicine

## 2017-10-12 DIAGNOSIS — Q613 Polycystic kidney, unspecified: Secondary | ICD-10-CM

## 2017-10-12 DIAGNOSIS — E7800 Pure hypercholesterolemia, unspecified: Secondary | ICD-10-CM | POA: Diagnosis not present

## 2017-10-12 DIAGNOSIS — E2839 Other primary ovarian failure: Secondary | ICD-10-CM | POA: Diagnosis not present

## 2017-10-12 DIAGNOSIS — Z8249 Family history of ischemic heart disease and other diseases of the circulatory system: Secondary | ICD-10-CM | POA: Diagnosis not present

## 2017-10-12 DIAGNOSIS — R51 Headache: Secondary | ICD-10-CM

## 2017-10-12 DIAGNOSIS — I700 Atherosclerosis of aorta: Secondary | ICD-10-CM | POA: Diagnosis not present

## 2017-10-12 DIAGNOSIS — Z0000 Encounter for general adult medical examination without abnormal findings: Secondary | ICD-10-CM | POA: Diagnosis not present

## 2017-10-12 DIAGNOSIS — R519 Headache, unspecified: Secondary | ICD-10-CM

## 2017-10-12 DIAGNOSIS — I10 Essential (primary) hypertension: Secondary | ICD-10-CM | POA: Diagnosis not present

## 2017-10-12 DIAGNOSIS — Z940 Kidney transplant status: Secondary | ICD-10-CM | POA: Diagnosis not present

## 2017-10-16 ENCOUNTER — Telehealth (INDEPENDENT_AMBULATORY_CARE_PROVIDER_SITE_OTHER): Payer: Self-pay

## 2017-10-16 DIAGNOSIS — N2581 Secondary hyperparathyroidism of renal origin: Secondary | ICD-10-CM | POA: Diagnosis not present

## 2017-10-16 DIAGNOSIS — Z940 Kidney transplant status: Secondary | ICD-10-CM | POA: Diagnosis not present

## 2017-10-16 DIAGNOSIS — E785 Hyperlipidemia, unspecified: Secondary | ICD-10-CM | POA: Diagnosis not present

## 2017-10-16 NOTE — Telephone Encounter (Signed)
Patient has a question.  She is having an MRI done later this week and she wants to know if the coils that you placed in her fistula will affect the MRI testing?   If so, what is she supposed to do?

## 2017-10-17 NOTE — Telephone Encounter (Signed)
Called the patient back and left a message for her stating that the MRI should not be canceled due to the coils in her fistula. I had to leave a message for the patient.

## 2017-10-18 ENCOUNTER — Ambulatory Visit
Admission: RE | Admit: 2017-10-18 | Discharge: 2017-10-18 | Disposition: A | Discharge status: Home / Self Care | Payer: 59 | Source: Ambulatory Visit | Attending: Internal Medicine | Admitting: Internal Medicine

## 2017-10-18 DIAGNOSIS — Q613 Polycystic kidney, unspecified: Secondary | ICD-10-CM | POA: Diagnosis not present

## 2017-10-18 DIAGNOSIS — I672 Cerebral atherosclerosis: Secondary | ICD-10-CM | POA: Insufficient documentation

## 2017-10-18 DIAGNOSIS — Z8249 Family history of ischemic heart disease and other diseases of the circulatory system: Secondary | ICD-10-CM | POA: Diagnosis not present

## 2017-10-18 DIAGNOSIS — R51 Headache: Secondary | ICD-10-CM | POA: Insufficient documentation

## 2017-10-18 DIAGNOSIS — R519 Headache, unspecified: Secondary | ICD-10-CM

## 2017-10-20 DIAGNOSIS — I770 Arteriovenous fistula, acquired: Secondary | ICD-10-CM | POA: Diagnosis not present

## 2017-10-20 DIAGNOSIS — Q612 Polycystic kidney, adult type: Secondary | ICD-10-CM | POA: Diagnosis not present

## 2017-10-20 DIAGNOSIS — I10 Essential (primary) hypertension: Secondary | ICD-10-CM | POA: Diagnosis not present

## 2017-10-20 DIAGNOSIS — Z940 Kidney transplant status: Secondary | ICD-10-CM | POA: Diagnosis not present

## 2017-10-20 DIAGNOSIS — E785 Hyperlipidemia, unspecified: Secondary | ICD-10-CM | POA: Diagnosis not present

## 2017-10-20 DIAGNOSIS — Z6827 Body mass index (BMI) 27.0-27.9, adult: Secondary | ICD-10-CM | POA: Diagnosis not present

## 2017-10-20 DIAGNOSIS — J984 Other disorders of lung: Secondary | ICD-10-CM | POA: Diagnosis not present

## 2017-10-31 ENCOUNTER — Encounter (INDEPENDENT_AMBULATORY_CARE_PROVIDER_SITE_OTHER): Payer: Self-pay | Admitting: Vascular Surgery

## 2017-10-31 ENCOUNTER — Ambulatory Visit (INDEPENDENT_AMBULATORY_CARE_PROVIDER_SITE_OTHER): Payer: 59

## 2017-10-31 ENCOUNTER — Ambulatory Visit (INDEPENDENT_AMBULATORY_CARE_PROVIDER_SITE_OTHER): Payer: 59 | Admitting: Vascular Surgery

## 2017-10-31 VITALS — BP 107/68 | HR 80 | Resp 15 | Ht 64.0 in | Wt 153.0 lb

## 2017-10-31 DIAGNOSIS — Z940 Kidney transplant status: Secondary | ICD-10-CM | POA: Diagnosis not present

## 2017-10-31 DIAGNOSIS — T829XXD Unspecified complication of cardiac and vascular prosthetic device, implant and graft, subsequent encounter: Secondary | ICD-10-CM

## 2017-10-31 DIAGNOSIS — E785 Hyperlipidemia, unspecified: Secondary | ICD-10-CM | POA: Diagnosis not present

## 2017-10-31 NOTE — Progress Notes (Signed)
MRN : [id number]  Megan Small is a 67 y.o. (1951/08/26) female who presents with chief complaint of  Chief Complaint  Patient presents with  . Follow-up    1 year HDA  .  History of Present Illness: Patient returns today in follow up of her right arm AVF.  She has continued to have good function from her renal transplant and has not used her fistula since her last visit.  She has lost 65 pounds intentionally and is doing quite well today.  She feels much better.  Her duplex today shows improvement in the previous velocities with a patent right brachiocephalic AV fistula without significant stenoses.  Current Outpatient Prescriptions  Medication Sig Dispense Refill  . acetaminophen (TYLENOL) 500 MG tablet Take 500 mg by mouth every 6 (six) hours as needed for mild pain.    Marland Kitchen aspirin 81 MG tablet Take 81 mg by mouth daily.    . calcium carbonate (TUMS EX) 750 MG chewable tablet Chew 1 tablet by mouth 2 (two) times daily as needed.     . cetirizine (ZYRTEC) 10 MG chewable tablet Chew 10 mg by mouth daily.    . Cholecalciferol (VITAMIN D-3) 1000 units CAPS Take 2 capsules by mouth 2 (two) times daily.     . clonazePAM (KLONOPIN) 1 MG tablet Take 1 mg by mouth 2 (two) times daily as needed for anxiety.    Marland Kitchen estradiol (ESTRACE) 0.5 MG tablet Take 0.5 mg by mouth daily.    . Fluticasone Furoate (ARNUITY ELLIPTA) 100 MCG/ACT AEPB Inhale 1 puff into the lungs daily. 30 each 5  . HYDROcodone-acetaminophen (NORCO/VICODIN) 5-325 MG tablet Take 1 tablet by mouth every 6 (six) hours as needed for moderate pain. 30 tablet 0  . magnesium oxide (MAG-OX) 400 MG tablet Take 400 mg by mouth daily.    . Melatonin 5 MG CAPS Take 1 capsule by mouth daily as needed.    . Multiple Vitamins-Minerals (CENTRUM SILVER PO) Take 1 capsule by mouth daily.    . mycophenolate (MYFORTIC) 180 MG EC tablet Take 180 mg by mouth 4 (four) times daily.    Marland Kitchen NIFEdipine (PROCARDIA XL/ADALAT-CC) 60 MG 24 hr  tablet Take 60 mg by mouth daily.    Marland Kitchen omeprazole (PRILOSEC) 20 MG capsule Take 20 mg by mouth daily.    . predniSONE (DELTASONE) 5 MG tablet Take 5 mg by mouth daily with breakfast.    . rosuvastatin (CRESTOR) 5 MG tablet Take 5 mg by mouth daily at 6 PM.     . Sulfamethoxazole-Trimethoprim (SULFAMETHOXAZOLE-TMP DS PO) Take 1 capsule by mouth 3 (three) times a week.     . tacrolimus (PROGRAF) 0.5 MG capsule Take 0.5 mg by mouth daily.    . tacrolimus (PROGRAF) 1 MG capsule Take 1 mg by mouth 2 (two) times daily.     No current facility-administered medications for this visit.         Past Medical History:  Diagnosis Date  . Blood dyscrasia   . Chronic kidney disease   . History of hiatal hernia   . Hyperlipidemia   . Hypertension          Past Surgical History:  Procedure Laterality Date  . ABDOMINAL HYSTERECTOMY    . CARDIAC CATHETERIZATION N/A 02/11/2016   Procedure: Left Heart Cath and Coronary Angiography;  Surgeon: Lorretta Harp, MD;  Location: [facility];  Service: Cardiovascular;  Laterality: N/A;  . CYSTOCELE REPAIR    .  KIDNEY TRANSPLANT    . pelvic prolapse    . PERIPHERAL VASCULAR CATHETERIZATION N/A 09/09/2015   Procedure: A/V Shuntogram/Fistulagram;  Surgeon: Algernon Huxley, MD;  Location: [facility];  Service: Cardiovascular;  Laterality: N/A;  . PERIPHERAL VASCULAR CATHETERIZATION N/A 09/09/2015   Procedure: A/V Shunt Intervention;  Surgeon: Algernon Huxley, MD;  Location: [facility];  Service: Cardiovascular;  Laterality: N/A;  . RECTOCELE REPAIR    . SHOULDER SURGERY Left     Social History     Social History  Substance Use Topics  . Smoking status: Never Smoker  . Smokeless tobacco: Never Used  . Alcohol use No    Family History       Family History  Problem Relation Age of Onset  . Pancreatic cancer Mother   . Aneurysm Mother     brain   . Heart disease Father   . Heart attack  Father   . Hyperlipidemia Father   . Polycystic kidney disease Father   . Leukemia Father   . Stroke Maternal Grandmother   . Heart attack Paternal Grandmother   . Polycystic kidney disease Paternal Grandfather   . Pancreatic cancer Brother   . Polycystic kidney disease           Allergies  Allergen Reactions  . Lisinopril Swelling  . Tape Rash    Ok with Paper Tape  Plastic tape tears skin     REVIEW OF SYSTEMS (Negative unless checked)  Constitutional: [] Weight loss  [] Fever  [] Chills Cardiac: [] Chest pain   [] Chest pressure   [] Palpitations   [] Shortness of breath when laying flat   [] Shortness of breath at rest   [] Shortness of breath with exertion. Vascular:  [] Pain in legs with walking   [] Pain in legs at rest   [] Pain in legs when laying flat   [] Claudication   [] Pain in feet when walking  [] Pain in feet at rest  [] Pain in feet when laying flat   [] History of DVT   [] Phlebitis   [] Swelling in legs   [] Varicose veins   [] Non-healing ulcers Pulmonary:   [] Uses home oxygen   [] Productive cough   [] Hemoptysis   [] Wheeze  [] COPD   [] Asthma Neurologic:  [] Dizziness  [] Blackouts   [] Seizures   [] History of stroke   [] History of TIA  [] Aphasia   [] Temporary blindness   [] Dysphagia   [] Weakness or numbness in arms   [] Weakness or numbness in legs Musculoskeletal:  [] Arthritis   [] Joint swelling   [] Joint pain   [] Low back pain Hematologic:  [] Easy bruising  [] Easy bleeding   [] Hypercoagulable state   [] Anemic   Gastrointestinal:  [] Blood in stool   [] Vomiting blood  [] Gastroesophageal reflux/heartburn   [] Abdominal pain Genitourinary:  [x] Chronic kidney disease   [] Difficult urination  [] Frequent urination  [] Burning with urination   [] Hematuria Skin:  [] Rashes   [] Ulcers   [] Wounds Psychological:  [] History of anxiety   []  History of major depression.      Physical Examination  BP 107/68 (BP Location: Left Arm, Patient Position: Sitting)   Pulse 80    Resp 15   Ht 5\' 4"  (1.626 m)   Wt 69.4 kg (153 lb)   BMI 26.26 kg/m  Gen:  WD/WN, NAD.  Looks younger with her lost weight Head: [place]/AT, No temporalis wasting. Ear/Nose/Throat: Hearing grossly intact, nares w/o erythema or drainage, trachea midline Eyes: Conjunctiva clear. Sclera non-icteric Neck: Supple.  No JVD.  Pulmonary:  Good air  movement, no use of accessory muscles.  Cardiac: RRR, normal S1, S2 Vascular: Good thrill in right brachiocephalic AV fistula Vessel Right Left  Radial Palpable Palpable                                    Musculoskeletal: M/S 5/5 throughout.  No deformity or atrophy.  No edema. Neurologic: Sensation grossly intact in extremities.  Symmetrical.  Speech is fluent.  Psychiatric: Judgment intact, Mood & affect appropriate for pt's clinical situation. Dermatologic: No rashes or ulcers noted.  No cellulitis or open wounds.       Labs No results found for this or any previous visit (from the past 2160 hour(s)).  Radiology Mr Jodene Nam Head Wo Contrast  Result Date: 10/18/2017 CLINICAL DATA:  Family history of aneurysm.Headaches becoming more frequent. Polycystic kidney disease. EXAM: MRA HEAD WITHOUT CONTRAST TECHNIQUE: Angiographic images of the Circle of Willis were obtained using MRA technique without intravenous contrast. COMPARISON:  MRA 09/21/2008 FINDINGS: Posterior circulation: LEFT vertebral dominant. RIGHT vertebral patent but diminutive following RIGHT-origin. No basilar stenosis. Both posterior cerebral arteries are robust in their proximal segments. Superior cerebellar arteries are widely patent. Normal LEFT PICA. RIGHT PICA poorly visualized. RIGHT AICA origin patent. Anterior circulation: The cervical, petrous, cavernous, and supraclinoid ICAs are all widely patent. LEFT ACA is dominant, with patent anterior communicating artery. Moderate to severe disease/hypoplasia RIGHT A1 ACA. Normal LEFT M1 MCA. Mild non stenotic atheromatous change  distal M1 segment RIGHT MCA. BILATERAL patent MCA bifurcations, mildly diseased. No visible saccular aneurysm at any intracranial branch point. IMPRESSION: Intracranial atherosclerotic disease, mildly progressed since 2010, but no flow-limiting stenosis is observed. As before, there is no visible saccular aneurysm. Electronically Signed   By: Staci Righter M.D.   On: 10/18/2017 20:33     Assessment/Plan History of renal transplant Currently working reasonably well. His not using her fistula, but she was high risk with her transplant and we need to keep the AV fistula available for backup if she needs it in the future.  Dyslipidemia lipid control important in reducing the progression of atherosclerotic disease. Continue statin therapy   Complication of vascular access for dialysis Her duplex today shows improvement in the previous velocities with a patent right brachiocephalic AV fistula without significant stenoses. We will continue to monitor this due to the multiple previous interventions the fact that this will likely be necessary if she outlives her fistula.  Recheck in 1 year.    Leotis Pain, MD  10/31/2017 11:28 AM    This note was created with Dragon medical transcription system.  Any errors from dictation are purely unintentional

## 2017-10-31 NOTE — Assessment & Plan Note (Signed)
Her duplex today shows improvement in the previous velocities with a patent right brachiocephalic AV fistula without significant stenoses. We will continue to monitor this due to the multiple previous interventions the fact that this will likely be necessary if she outlives her fistula.  Recheck in 1 year.

## 2017-11-06 ENCOUNTER — Ambulatory Visit
Admission: RE | Admit: 2017-11-06 | Discharge: 2017-11-06 | Disposition: A | Discharge status: Home / Self Care | Payer: 59 | Source: Ambulatory Visit | Attending: Internal Medicine | Admitting: Internal Medicine

## 2017-11-06 DIAGNOSIS — E2839 Other primary ovarian failure: Secondary | ICD-10-CM | POA: Diagnosis not present

## 2017-11-06 DIAGNOSIS — M419 Scoliosis, unspecified: Secondary | ICD-10-CM | POA: Diagnosis not present

## 2017-11-06 DIAGNOSIS — Z13820 Encounter for screening for osteoporosis: Secondary | ICD-10-CM | POA: Diagnosis not present

## 2017-11-29 DIAGNOSIS — R110 Nausea: Secondary | ICD-10-CM | POA: Diagnosis not present

## 2017-11-29 DIAGNOSIS — N2581 Secondary hyperparathyroidism of renal origin: Secondary | ICD-10-CM | POA: Diagnosis not present

## 2017-11-29 DIAGNOSIS — Z940 Kidney transplant status: Secondary | ICD-10-CM | POA: Diagnosis not present

## 2017-11-29 DIAGNOSIS — R7309 Other abnormal glucose: Secondary | ICD-10-CM | POA: Diagnosis not present

## 2018-01-18 DIAGNOSIS — Z940 Kidney transplant status: Secondary | ICD-10-CM | POA: Diagnosis not present

## 2018-01-18 DIAGNOSIS — R110 Nausea: Secondary | ICD-10-CM | POA: Diagnosis not present

## 2018-01-18 DIAGNOSIS — N2581 Secondary hyperparathyroidism of renal origin: Secondary | ICD-10-CM | POA: Diagnosis not present

## 2018-01-31 DIAGNOSIS — I770 Arteriovenous fistula, acquired: Secondary | ICD-10-CM | POA: Diagnosis not present

## 2018-01-31 DIAGNOSIS — Z6827 Body mass index (BMI) 27.0-27.9, adult: Secondary | ICD-10-CM | POA: Diagnosis not present

## 2018-01-31 DIAGNOSIS — I10 Essential (primary) hypertension: Secondary | ICD-10-CM | POA: Diagnosis not present

## 2018-01-31 DIAGNOSIS — E785 Hyperlipidemia, unspecified: Secondary | ICD-10-CM | POA: Diagnosis not present

## 2018-01-31 DIAGNOSIS — Z940 Kidney transplant status: Secondary | ICD-10-CM | POA: Diagnosis not present

## 2018-01-31 DIAGNOSIS — J984 Other disorders of lung: Secondary | ICD-10-CM | POA: Diagnosis not present

## 2018-01-31 DIAGNOSIS — Q612 Polycystic kidney, adult type: Secondary | ICD-10-CM | POA: Diagnosis not present

## 2018-02-05 DIAGNOSIS — Z85828 Personal history of other malignant neoplasm of skin: Secondary | ICD-10-CM | POA: Diagnosis not present

## 2018-02-05 DIAGNOSIS — C44319 Basal cell carcinoma of skin of other parts of face: Secondary | ICD-10-CM | POA: Diagnosis not present

## 2018-02-20 DIAGNOSIS — R7309 Other abnormal glucose: Secondary | ICD-10-CM | POA: Diagnosis not present

## 2018-02-20 DIAGNOSIS — D2271 Melanocytic nevi of right lower limb, including hip: Secondary | ICD-10-CM | POA: Diagnosis not present

## 2018-02-20 DIAGNOSIS — D225 Melanocytic nevi of trunk: Secondary | ICD-10-CM | POA: Diagnosis not present

## 2018-02-20 DIAGNOSIS — L821 Other seborrheic keratosis: Secondary | ICD-10-CM | POA: Diagnosis not present

## 2018-02-20 DIAGNOSIS — R110 Nausea: Secondary | ICD-10-CM | POA: Diagnosis not present

## 2018-02-20 DIAGNOSIS — Z85828 Personal history of other malignant neoplasm of skin: Secondary | ICD-10-CM | POA: Diagnosis not present

## 2018-02-20 DIAGNOSIS — Z940 Kidney transplant status: Secondary | ICD-10-CM | POA: Diagnosis not present

## 2018-02-20 DIAGNOSIS — N2581 Secondary hyperparathyroidism of renal origin: Secondary | ICD-10-CM | POA: Diagnosis not present

## 2018-02-20 DIAGNOSIS — L918 Other hypertrophic disorders of the skin: Secondary | ICD-10-CM | POA: Diagnosis not present

## 2018-02-20 DIAGNOSIS — D2272 Melanocytic nevi of left lower limb, including hip: Secondary | ICD-10-CM | POA: Diagnosis not present

## 2018-03-13 DIAGNOSIS — Z85828 Personal history of other malignant neoplasm of skin: Secondary | ICD-10-CM | POA: Diagnosis not present

## 2018-03-13 DIAGNOSIS — C4402 Squamous cell carcinoma of skin of lip: Secondary | ICD-10-CM | POA: Diagnosis not present

## 2018-03-22 DIAGNOSIS — Z940 Kidney transplant status: Secondary | ICD-10-CM | POA: Diagnosis not present

## 2018-03-26 DIAGNOSIS — Z1211 Encounter for screening for malignant neoplasm of colon: Secondary | ICD-10-CM | POA: Diagnosis not present

## 2018-03-26 DIAGNOSIS — Z940 Kidney transplant status: Secondary | ICD-10-CM | POA: Diagnosis not present

## 2018-04-03 DIAGNOSIS — D126 Benign neoplasm of colon, unspecified: Secondary | ICD-10-CM | POA: Diagnosis not present

## 2018-04-03 DIAGNOSIS — Z1211 Encounter for screening for malignant neoplasm of colon: Secondary | ICD-10-CM | POA: Diagnosis not present

## 2018-04-24 DIAGNOSIS — R7309 Other abnormal glucose: Secondary | ICD-10-CM | POA: Diagnosis not present

## 2018-04-24 DIAGNOSIS — E785 Hyperlipidemia, unspecified: Secondary | ICD-10-CM | POA: Diagnosis not present

## 2018-04-24 DIAGNOSIS — R110 Nausea: Secondary | ICD-10-CM | POA: Diagnosis not present

## 2018-04-24 DIAGNOSIS — Z940 Kidney transplant status: Secondary | ICD-10-CM | POA: Diagnosis not present

## 2018-05-03 DIAGNOSIS — E785 Hyperlipidemia, unspecified: Secondary | ICD-10-CM | POA: Diagnosis not present

## 2018-05-03 DIAGNOSIS — Z6827 Body mass index (BMI) 27.0-27.9, adult: Secondary | ICD-10-CM | POA: Diagnosis not present

## 2018-05-03 DIAGNOSIS — Z940 Kidney transplant status: Secondary | ICD-10-CM | POA: Diagnosis not present

## 2018-05-03 DIAGNOSIS — Q612 Polycystic kidney, adult type: Secondary | ICD-10-CM | POA: Diagnosis not present

## 2018-05-03 DIAGNOSIS — I10 Essential (primary) hypertension: Secondary | ICD-10-CM | POA: Diagnosis not present

## 2018-05-03 DIAGNOSIS — I770 Arteriovenous fistula, acquired: Secondary | ICD-10-CM | POA: Diagnosis not present

## 2018-05-22 DIAGNOSIS — Z940 Kidney transplant status: Secondary | ICD-10-CM | POA: Diagnosis not present

## 2018-05-24 DIAGNOSIS — Z23 Encounter for immunization: Secondary | ICD-10-CM | POA: Diagnosis not present

## 2018-05-25 NOTE — Progress Notes (Signed)
[name]      MRN# [id number] Megan Small 11/01/1950    Assessment and Plan: 67 yo female seen in follow-up visit for chronic bronchitis, history of kidney transplant with lung nodule.    Chronic bronchitis with dyspnea on exertion. --Now appears stable. --Stop Arnuity inhaler, can restart if symptoms occur.  Lung nodule. -- 03/28/16 RML 75mm nodule. Repeat Ct chest CT chest 05/23/17 repeat ct chest was negative for nodule.  No further follow up needed.   Status post renal transplant. --On mycophenolate and prograf.   Obesity. -Obesity is likely contributing to dyspnea, continue weight loss.  Return if symptoms worsen or fail to improve.   CC: Chief Complaint  Patient presents with  . Follow-up    pt states cough is controlled,she d/c Arnuity inhaler.      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 PFTs with mild restriction/obstruction, HRCT with air trapping, no ILD findings. Trial of ICS given significant response to BD on PFTs   Events since last clinic visit: Today she feels that breathing is doing well, she occasionally has dyspnea when doing hard yard work but otherwise has no problems. She stopped the Arnuity and noted no difference when off of it.  She has lost 70 lbs after joining weight watchers and is doing much better.   CT chest/18/18>> lungs are normal, no nodules noted.   Review of Systems:  Constitutional: Feels well. Cardiovascular: Denies chest pain, exertional chest pain.  Pulmonary: Denies hemoptysis, pleuritic chest pain.   The remainder of systems were reviewed and were found to be negative other than what is documented in the HPI.    Physical Examination:   VS: BP 118/70 (BP Location: Left Arm, Cuff Size: Normal)   Pulse 72   Resp 16   Ht 5\' 4"  (1.626 m)   Wt 169 lb (76.7 kg)   SpO2 99%   BMI 29.01 kg/m   General Appearance:  No distress  Neuro:without focal findings, mental status, speech normal, alert and oriented HEENT: PERRLA, EOM intact Pulmonary: No wheezing, No rales  CardiovascularNormal S1,S2.  No m/r/g.  Abdomen: Benign, Soft, non-tender, No masses Renal:  No costovertebral tenderness  GU:  No performed at this time. Endoc: No evident thyromegaly, no signs of acromegaly or Cushing features Skin:   warm, no rashes, no ecchymosis  Extremities: normal, no cyanosis, clubbing.     Allergies:  Lisinopril and Tape  Physical Examination:  VS: BP 118/70 (BP Location: Left Arm, Cuff Size: Normal)   Pulse 72   Resp 16   Ht 5\' 4"  (1.626 m)   Wt 169 lb (76.7 kg)   SpO2 99%   BMI 29.01 kg/m   General Appearance: No distress  HEENT: PERRLA, no ptosis, no other lesions noticed Pulmonary:normal breath sounds., diaphragmatic excursion normal.No wheezing, No rales   Cardiovascular:  Normal S1,S2.  No m/r/g.     Abdomen:Exam: Benign, Soft, non-tender, No masses  Skin:   warm, no rashes, no ecchymosis  Extremities: normal, no cyanosis, clubbing, warm with normal capillary refill.      Rad results: (The following images and results were reviewed by Dr. Stevenson Clinch on 05/25/2018). HRCT 03/28/16 EXAM: CT CHEST WITHOUT CONTRAST TECHNIQUE: Multidetector CT imaging of the chest was performed following the standard protocol without intravenous contrast. High resolution imaging of the lungs, as well as inspiratory and expiratory imaging, was performed. COMPARISON:  No priors.  FINDINGS: Cardiovascular: Heart size is normal. There is no significant pericardial fluid, thickening or pericardial calcification. There is aortic atherosclerosis, as well as atherosclerosis of the great vessels of the mediastinum and the coronary arteries, including calcified atherosclerotic plaque in the right coronary artery. Mediastinum/Nodes: No pathologically enlarged mediastinal or hilar lymph nodes. Please note that accurate  exclusion of hilar adenopathy is limited on noncontrast CT scans. Small hiatal hernia. No axillary lymphadenopathy.  Lungs/Pleura: High-resolution images demonstrate no significant regions of ground-glass attenuation, subpleural reticulation, traction bronchiectasis or frank honeycombing to indicate interstitial lung disease. There are some areas of mild linear scarring in the lung bases bilaterally. Inspiratory and expiratory imaging demonstrates mild air trapping, indicative of mild small airways disease. No acute consolidative airspace disease. No pleural effusions. 3 mm pulmonary nodule in the right middle lobe (image 79 of series 3) is highly nonspecific. No larger more suspicious appearing pulmonary nodules or masses are otherwise noted.  Upper Abdomen: Innumerable low-attenuation lesions scattered throughout the liver, incompletely characterized on today's noncontrast CT examination, but similar to prior study, likely multiple cysts, with the largest of these measuring up to 4.4 cm in segments 7 and 8. Superior aspects of the kidneys are incompletely visualized, but also demonstrate innumerable predominantly low-attenuation lesions. There also several high attenuation lesions in the kidneys bilaterally. These are all incompletely characterized on today's noncontrast CT examination, but also likely represent a combination of simple cysts and proteinaceous/hemorrhagic cysts, with the largest hemorrhagic cyst in the upper pole of the right kidney measuring 2.6 cm in diameter. Aortic atherosclerosis. Musculoskeletal: There are no aggressive appearing lytic or blastic lesions noted in the visualized portions of the skeleton.  IMPRESSION: 1. No findings to suggest interstitial lung disease. 2. Tiny 3 mm right middle lobe pulmonary nodules nonspecific and statistically likely benign. No follow-up needed if patient is low-risk. Non-contrast chest CT can be considered in 12 months  if patient is high-risk. This recommendation follows the consensus statement: Guidelines for Management of Incidental Pulmonary Nodules Detected on CT Images:From the Fleischner Society 2017; published online before print (10.1148/radiol.2725366440). 3. Aortic atherosclerosis, in addition to right coronary artery disease. Please note that although the presence of coronary artery calcium documents the presence of coronary artery disease, the severity of this disease and any potential stenosis cannot be assessed on this non-gated CT examination. Assessment for potential risk factor modification, dietary therapy or pharmacologic therapy may be warranted, if clinically indicated. 4. 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       Updated Medication List Outpatient Encounter Medications as of 05/28/2018  Medication Sig  . acetaminophen (TYLENOL) 500 MG tablet Take 500 mg by mouth every 6 (six) hours as needed for mild pain.  Marland Kitchen amLODipine (NORVASC) 10 MG tablet amlodipine 10 mg tablet  Take 1 tablet every day by oral route.  . ARNUITY ELLIPTA 100 MCG/ACT AEPB INHALE 1 PUFF INTO THE LUNGS DAILY.  Marland Kitchen aspirin 81 MG tablet Take 81 mg by mouth daily.  Marland Kitchen atorvastatin (LIPITOR) 10 MG tablet atorvastatin 10 mg tablet  Take 1 tablet every OTHER day by oral route.  . calcium carbonate (TUMS EX) 750 MG chewable tablet Chew 1 tablet by mouth 2 (two) times daily as needed.   . cetirizine (ZYRTEC) 10 MG chewable tablet Chew 10 mg by mouth daily.  . Cholecalciferol (VITAMIN D-3) 1000 units CAPS Take 1 capsule by mouth 2 (two) times daily.   . clonazePAM (KLONOPIN) 1 MG tablet  Take 1 mg by mouth 2 (two) times daily as needed for anxiety.  Marland Kitchen estradiol (ESTRACE) 0.5 MG tablet Take 0.5 mg by mouth daily.  . furosemide (LASIX) 80 MG tablet furosemide 80 mg tablet AS NEEDED  . HYDROcodone-acetaminophen (NORCO/VICODIN) 5-325 MG tablet Take 1 tablet by  mouth every 6 (six) hours as needed for moderate pain. (Patient not taking: Reported on 10/31/2017)  . lidocaine (LIDODERM) 5 % lidocaine 5 % topical patch  . losartan-hydrochlorothiazide (HYZAAR) 100-25 MG tablet losartan 100 mg-hydrochlorothiazide 25 mg tablet  TAKE 1 TABLET BY MOUTH EVERY DAY  . lovastatin (ALTOPREV) 40 MG 24 hr tablet Altoprev 40 mg tablet,extended release  TAKE 1 TABLET BY MOUTH EVERY DAY  . magnesium oxide (MAG-OX) 400 MG tablet Take 200 mg by mouth daily.   . Melatonin 5 MG CAPS Take 1 capsule by mouth daily as needed.  . Multiple Vitamins-Minerals (CENTRUM SILVER PO) Take 1 capsule by mouth daily.  . mycophenolate (MYFORTIC) 180 MG EC tablet Take 180 mg by mouth 4 (four) times daily.  Marland Kitchen NIFEdipine (PROCARDIA XL/ADALAT-CC) 60 MG 24 hr tablet Take 60 mg by mouth daily.  Marland Kitchen omeprazole (PRILOSEC) 20 MG capsule Take 20 mg by mouth daily.  . ondansetron (ZOFRAN) 8 MG tablet ondansetron HCl 8 mg tablet  . PARoxetine Mesylate (BRISDELLE) 7.5 MG CAPS [place] 7.5 mg capsule  Take 1 capsule every day by oral route.  . predniSONE (DELTASONE) 5 MG tablet Take 5 mg by mouth daily with breakfast.  . promethazine-dextromethorphan (PROMETHAZINE-DM) 6.25-15 MG/5ML syrup promethazine-DM 6.25 mg-15 mg/5 mL syrup  . rosuvastatin (CRESTOR) 5 MG tablet Take 5 mg by mouth daily at 6 PM. Every other day.  . Sulfamethoxazole-Trimethoprim (SULFAMETHOXAZOLE-TMP DS PO) Take 1 capsule by mouth 3 (three) times a week.   . tacrolimus (PROGRAF) 0.5 MG capsule Take 0.5 mg by mouth 2 (two) times daily.   . tacrolimus (PROGRAF) 1 MG capsule Take 1 mg by mouth 2 (two) times daily.   No facility-administered encounter medications on file as of 05/28/2018.     Orders for this visit: No orders of the defined types were placed in this encounter.   Thank  you for the visitation and for allowing  [facility] to assist in the care of your patient. Our recommendations are noted above.   Please contact us if we can be of further service.  Marda Stalker MD [facility] Office Number: [phone]

## 2018-05-28 ENCOUNTER — Encounter: Payer: Self-pay | Admitting: Internal Medicine

## 2018-05-28 ENCOUNTER — Ambulatory Visit (INDEPENDENT_AMBULATORY_CARE_PROVIDER_SITE_OTHER): Payer: 59 | Admitting: Internal Medicine

## 2018-05-28 VITALS — BP 118/70 | HR 72 | Resp 16 | Ht 64.0 in | Wt 169.0 lb

## 2018-05-28 DIAGNOSIS — R911 Solitary pulmonary nodule: Secondary | ICD-10-CM | POA: Diagnosis not present

## 2018-05-28 DIAGNOSIS — R05 Cough: Secondary | ICD-10-CM | POA: Diagnosis not present

## 2018-05-28 DIAGNOSIS — R053 Chronic cough: Secondary | ICD-10-CM

## 2018-05-28 NOTE — Patient Instructions (Signed)
Continue to as active as tolerated.

## 2018-06-26 DIAGNOSIS — Z940 Kidney transplant status: Secondary | ICD-10-CM | POA: Diagnosis not present

## 2018-06-26 DIAGNOSIS — R110 Nausea: Secondary | ICD-10-CM | POA: Diagnosis not present

## 2018-07-24 DIAGNOSIS — R110 Nausea: Secondary | ICD-10-CM | POA: Diagnosis not present

## 2018-07-24 DIAGNOSIS — Z940 Kidney transplant status: Secondary | ICD-10-CM | POA: Diagnosis not present

## 2018-07-31 DIAGNOSIS — Z940 Kidney transplant status: Secondary | ICD-10-CM | POA: Diagnosis not present

## 2018-08-01 DIAGNOSIS — E785 Hyperlipidemia, unspecified: Secondary | ICD-10-CM | POA: Diagnosis not present

## 2018-08-01 DIAGNOSIS — I10 Essential (primary) hypertension: Secondary | ICD-10-CM | POA: Diagnosis not present

## 2018-08-01 DIAGNOSIS — Z6827 Body mass index (BMI) 27.0-27.9, adult: Secondary | ICD-10-CM | POA: Diagnosis not present

## 2018-08-01 DIAGNOSIS — Q612 Polycystic kidney, adult type: Secondary | ICD-10-CM | POA: Diagnosis not present

## 2018-08-01 DIAGNOSIS — Z940 Kidney transplant status: Secondary | ICD-10-CM | POA: Diagnosis not present

## 2018-08-08 DIAGNOSIS — Z940 Kidney transplant status: Secondary | ICD-10-CM | POA: Diagnosis not present

## 2018-08-14 DIAGNOSIS — M7130 Other bursal cyst, unspecified site: Secondary | ICD-10-CM | POA: Diagnosis not present

## 2018-08-14 DIAGNOSIS — D225 Melanocytic nevi of trunk: Secondary | ICD-10-CM | POA: Diagnosis not present

## 2018-08-14 DIAGNOSIS — D692 Other nonthrombocytopenic purpura: Secondary | ICD-10-CM | POA: Diagnosis not present

## 2018-08-14 DIAGNOSIS — D485 Neoplasm of uncertain behavior of skin: Secondary | ICD-10-CM | POA: Diagnosis not present

## 2018-08-14 DIAGNOSIS — D0462 Carcinoma in situ of skin of left upper limb, including shoulder: Secondary | ICD-10-CM | POA: Diagnosis not present

## 2018-08-14 DIAGNOSIS — Z85828 Personal history of other malignant neoplasm of skin: Secondary | ICD-10-CM | POA: Diagnosis not present

## 2018-08-14 DIAGNOSIS — L821 Other seborrheic keratosis: Secondary | ICD-10-CM | POA: Diagnosis not present

## 2018-08-20 DIAGNOSIS — M5431 Sciatica, right side: Secondary | ICD-10-CM | POA: Diagnosis not present

## 2018-08-20 DIAGNOSIS — S39012A Strain of muscle, fascia and tendon of lower back, initial encounter: Secondary | ICD-10-CM | POA: Diagnosis not present

## 2018-08-20 DIAGNOSIS — M5432 Sciatica, left side: Secondary | ICD-10-CM | POA: Diagnosis not present

## 2018-08-21 DIAGNOSIS — Z85828 Personal history of other malignant neoplasm of skin: Secondary | ICD-10-CM | POA: Diagnosis not present

## 2018-08-21 DIAGNOSIS — D0462 Carcinoma in situ of skin of left upper limb, including shoulder: Secondary | ICD-10-CM | POA: Diagnosis not present

## 2018-09-06 ENCOUNTER — Other Ambulatory Visit: Payer: Self-pay | Admitting: Obstetrics & Gynecology

## 2018-09-06 DIAGNOSIS — Z1231 Encounter for screening mammogram for malignant neoplasm of breast: Secondary | ICD-10-CM

## 2018-09-17 ENCOUNTER — Other Ambulatory Visit: Payer: Self-pay | Admitting: Obstetrics & Gynecology

## 2018-09-17 DIAGNOSIS — Z01419 Encounter for gynecological examination (general) (routine) without abnormal findings: Secondary | ICD-10-CM | POA: Diagnosis not present

## 2018-09-17 DIAGNOSIS — N644 Mastodynia: Secondary | ICD-10-CM

## 2018-09-17 DIAGNOSIS — Z6830 Body mass index (BMI) 30.0-30.9, adult: Secondary | ICD-10-CM | POA: Diagnosis not present

## 2018-09-21 ENCOUNTER — Ambulatory Visit: Payer: 59

## 2018-09-21 ENCOUNTER — Ambulatory Visit
Admission: RE | Admit: 2018-09-21 | Discharge: 2018-09-21 | Disposition: A | Discharge status: Home / Self Care | Payer: 59 | Source: Ambulatory Visit | Attending: Obstetrics & Gynecology | Admitting: Obstetrics & Gynecology

## 2018-09-21 DIAGNOSIS — R922 Inconclusive mammogram: Secondary | ICD-10-CM | POA: Diagnosis not present

## 2018-09-21 DIAGNOSIS — R7309 Other abnormal glucose: Secondary | ICD-10-CM | POA: Diagnosis not present

## 2018-09-21 DIAGNOSIS — R110 Nausea: Secondary | ICD-10-CM | POA: Diagnosis not present

## 2018-09-21 DIAGNOSIS — Z940 Kidney transplant status: Secondary | ICD-10-CM | POA: Diagnosis not present

## 2018-09-21 DIAGNOSIS — N644 Mastodynia: Secondary | ICD-10-CM

## 2018-10-05 ENCOUNTER — Ambulatory Visit: Payer: 59

## 2018-10-23 DIAGNOSIS — I10 Essential (primary) hypertension: Secondary | ICD-10-CM | POA: Diagnosis not present

## 2018-10-23 DIAGNOSIS — E785 Hyperlipidemia, unspecified: Secondary | ICD-10-CM | POA: Diagnosis not present

## 2018-10-23 DIAGNOSIS — Z6827 Body mass index (BMI) 27.0-27.9, adult: Secondary | ICD-10-CM | POA: Diagnosis not present

## 2018-10-23 DIAGNOSIS — Z940 Kidney transplant status: Secondary | ICD-10-CM | POA: Diagnosis not present

## 2018-10-23 DIAGNOSIS — Q612 Polycystic kidney, adult type: Secondary | ICD-10-CM | POA: Diagnosis not present

## 2018-10-30 ENCOUNTER — Encounter (INDEPENDENT_AMBULATORY_CARE_PROVIDER_SITE_OTHER): Payer: Self-pay | Admitting: Vascular Surgery

## 2018-10-30 ENCOUNTER — Ambulatory Visit (INDEPENDENT_AMBULATORY_CARE_PROVIDER_SITE_OTHER): Payer: 59 | Admitting: Vascular Surgery

## 2018-10-30 ENCOUNTER — Ambulatory Visit (INDEPENDENT_AMBULATORY_CARE_PROVIDER_SITE_OTHER): Payer: 59

## 2018-10-30 VITALS — BP 153/72 | HR 71 | Resp 16 | Ht 64.0 in | Wt 189.4 lb

## 2018-10-30 DIAGNOSIS — Z940 Kidney transplant status: Secondary | ICD-10-CM | POA: Diagnosis not present

## 2018-10-30 DIAGNOSIS — T829XXD Unspecified complication of cardiac and vascular prosthetic device, implant and graft, subsequent encounter: Secondary | ICD-10-CM

## 2018-10-30 DIAGNOSIS — E785 Hyperlipidemia, unspecified: Secondary | ICD-10-CM | POA: Diagnosis not present

## 2018-10-30 NOTE — Assessment & Plan Note (Signed)
Duplex today shows some mildly elevated velocities at the subclavian vein cephalic vein confluence that are borderline hemodynamically significant.  No role for intervention.  Recheck in 1 year or sooner if problems develop in the interim.

## 2018-10-30 NOTE — Progress Notes (Signed)
MRN : [id number]  Megan Small is a 68 y.o. (04-10-1951) female who presents with chief complaint of  Chief Complaint  Patient presents with  . Follow-up    ultrasound follow up  .  History of Present Illness: Patient returns today in follow up of her right arm AV fistula.  She has not had to use the fistula now in several years.  This had multiple interventions when we first placed the fistula and getting it usable 7 to 8 years ago.  Her renal transplant is working well. Duplex today shows some mildly elevated velocities at the subclavian vein cephalic vein confluence that are borderline hemodynamically significant.  Current Outpatient Medications  Medication Sig Dispense Refill  . acetaminophen (TYLENOL) 500 MG tablet Take 500 mg by mouth every 6 (six) hours as needed for mild pain.    Marland Kitchen aspirin 81 MG tablet Take 81 mg by mouth daily.    . calcium carbonate (TUMS EX) 750 MG chewable tablet Chew 1 tablet by mouth 2 (two) times daily as needed.     . cetirizine (ZYRTEC) 10 MG chewable tablet Chew 10 mg by mouth daily.    . Cholecalciferol (VITAMIN D-3) 1000 units CAPS Take 1 capsule by mouth daily.     . clonazePAM (KLONOPIN) 1 MG tablet Take 1 mg by mouth 2 (two) times daily as needed for anxiety.    Marland Kitchen estradiol (ESTRACE) 0.5 MG tablet Take 0.5 mg by mouth daily.    . furosemide (LASIX) 80 MG tablet furosemide 80 mg tablet AS NEEDED    . HYDROcodone-acetaminophen (NORCO/VICODIN) 5-325 MG tablet Take 1 tablet by mouth every 6 (six) hours as needed for moderate pain. 30 tablet 0  . Melatonin 5 MG CAPS Take 1 capsule by mouth daily as needed.    . Multiple Vitamins-Minerals (CENTRUM SILVER PO) Take 1 capsule by mouth daily.    . mycophenolate (MYFORTIC) 180 MG EC tablet Take 180 mg by mouth 4 (four) times daily.    Marland Kitchen NIFEdipine (PROCARDIA XL/ADALAT-CC) 60 MG 24 hr tablet Take 60 mg by mouth daily.    Marland Kitchen omeprazole (PRILOSEC) 20 MG capsule Take 20 mg by mouth daily.    . ondansetron  (ZOFRAN) 8 MG tablet ondansetron HCl 8 mg tablet    . PARoxetine Mesylate (BRISDELLE) 7.5 MG CAPS [place] 7.5 mg capsule  Take 1 capsule every day by oral route.    . predniSONE (DELTASONE) 5 MG tablet Take 5 mg by mouth daily with breakfast.    . rosuvastatin (CRESTOR) 5 MG tablet Take 5 mg by mouth daily at 6 PM. Every other day.    . Sulfamethoxazole-Trimethoprim (SULFAMETHOXAZOLE-TMP DS PO) Take 1 capsule by mouth 3 (three) times a week.     . tacrolimus (PROGRAF) 0.5 MG capsule Take 0.5 mg by mouth 2 (two) times daily.     . tacrolimus (PROGRAF) 1 MG capsule Take 1 mg by mouth 2 (two) times daily.     No current facility-administered medications for this visit.     Past Medical History:  Diagnosis Date  . Blood dyscrasia   . Chronic kidney disease   . History of hiatal hernia   . Hyperlipidemia   . Hypertension     Past Surgical History:  Procedure Laterality Date  . ABDOMINAL HYSTERECTOMY    . BREAST EXCISIONAL BIOPSY    . CARDIAC CATHETERIZATION N/A 02/11/2016   Procedure: Left Heart Cath and Coronary Angiography;  Surgeon: Lorretta Harp, MD;  Location:  [facility];  Service: Cardiovascular;  Laterality: N/A;  . CYSTOCELE REPAIR    . KIDNEY TRANSPLANT    . pelvic prolapse    . PERIPHERAL VASCULAR CATHETERIZATION N/A 09/09/2015   Procedure: A/V Shuntogram/Fistulagram;  Surgeon: Algernon Huxley, MD;  Location: [facility];  Service: Cardiovascular;  Laterality: N/A;  . PERIPHERAL VASCULAR CATHETERIZATION N/A 09/09/2015   Procedure: A/V Shunt Intervention;  Surgeon: Algernon Huxley, MD;  Location: [facility] [facility];  Service: Cardiovascular;  Laterality: N/A;  . RECTOCELE REPAIR    . SHOULDER SURGERY Left    Social History  Substance Use Topics  . Smoking status: Never Smoker  . Smokeless tobacco: Never Used  . Alcohol use No    Family History       Family History  Problem Relation Age of Onset  . Pancreatic cancer Mother   . Aneurysm Mother       brain   . Heart disease Father   . Heart attack Father   . Hyperlipidemia Father   . Polycystic kidney disease Father   . Leukemia Father   . Stroke Maternal Grandmother   . Heart attack Paternal Grandmother   . Polycystic kidney disease Paternal Grandfather   . Pancreatic cancer Brother   . Polycystic kidney disease           Allergies  Allergen Reactions  . Lisinopril Swelling  . Tape Rash    Ok with Paper Tape  Plastic tape tears skin     REVIEW OF SYSTEMS(Negative unless checked)  Constitutional: [] ?Weight loss[] ?Fever[] ?Chills Cardiac:[] ?Chest pain[] ?Chest pressure[] ?Palpitations [] ?Shortness of breath when laying flat [] ?Shortness of breath at rest [] ?Shortness of breath with exertion. Vascular: [] ?Pain in legs with walking[] ?Pain in legsat rest[] ?Pain in legs when laying flat [] ?Claudication [] ?Pain in feet when walking [] ?Pain in feet at rest [] ?Pain in feet when laying flat [] ?History of DVT [] ?Phlebitis [] ?Swelling in legs [] ?Varicose veins [] ?Non-healing ulcers Pulmonary: [] ?Uses home oxygen [] ?Productive cough[] ?Hemoptysis [] ?Wheeze [] ?COPD [] ?Asthma Neurologic: [] ?Dizziness [] ?Blackouts [] ?Seizures [] ?History of stroke [] ?History of TIA[] ?Aphasia [] ?Temporary blindness[] ?Dysphagia [] ?Weaknessor numbness in arms [] ?Weakness or numbnessin legs Musculoskeletal: [] ?Arthritis [] ?Joint swelling [] ?Joint pain [] ?Low back pain Hematologic:[] ?Easy bruising[] ?Easy bleeding [] ?Hypercoagulable state [] ?Anemic  Gastrointestinal:[] ?Blood in stool[] ?Vomiting blood[] ?Gastroesophageal reflux/heartburn[] ?Abdominal pain Genitourinary: [x] ?Chronic kidney disease [] ?Difficulturination [] ?Frequenturination [] ?Burning with urination[] ?Hematuria Skin: [] ?Rashes [] ?Ulcers [] ?Wounds Psychological: [] ?History of anxiety[] ?History of major  depression.    Physical Examination  BP (!) 153/72 (BP Location: Left Arm)   Pulse 71   Resp 16   Ht 5\' 4"  (1.626 m)   Wt 189 lb 6.4 oz (85.9 kg)   BMI 32.51 kg/m  Gen:  WD/WN, NAD Head: [place]/AT, No temporalis wasting. Ear/Nose/Throat: Hearing grossly intact, nares w/o erythema or drainage Eyes: Conjunctiva clear. Sclera non-icteric Neck: Supple.  Trachea midline Pulmonary:  Good air movement, no use of accessory muscles.  Cardiac: RRR, no JVD Vascular: Excellent thrill in right upper arm AV fistula Vessel Right Left  Radial Palpable Palpable                           Musculoskeletal: M/S 5/5 throughout.  No deformity or atrophy.  Neurologic: Sensation grossly intact in extremities.  Symmetrical.  Speech is fluent.  Psychiatric: Judgment intact, Mood & affect appropriate for pt's clinical situation. Dermatologic: No rashes or ulcers noted.  No cellulitis or open wounds.       Labs No results found for this or any previous visit (from the past 2160 hour(s)).  Radiology Vas US Duplex Dialysis Access (avf,avg)  Result Date: 10/30/2018 DIALYSIS ACCESS Access Site: Right Upper Extremity. Access Type: Brachial-cephalic AVF. History: Right brachial-cephalic AVF on 7/82/95 with multiple subsequent          interventions. Comparison Study: 10/31/2017 Performing Technologist: Almira Coaster RVS  Examination Guidelines: A complete evaluation includes B-mode imaging, spectral Doppler, color Doppler, and power Doppler as needed of all accessible portions of each vessel. Unilateral testing is considered an integral part of a complete examination. Limited examinations for reoccurring indications may be performed as noted.  Findings: +--------------------+----------+-----------------+--------+ AVF                 PSV (cm/s)Flow Vol (mL/min)Comments +--------------------+----------+-----------------+--------+ Native artery inflow   266          2624                 +--------------------+----------+-----------------+--------+ AVF Anastomosis        267                              +--------------------+----------+-----------------+--------+  +---------------+----------+-------------+----------+-------------------------+ OUTFLOW VEIN   PSV (cm/s)Diameter (cm)Depth (cm)        Describe          +---------------+----------+-------------+----------+-------------------------+ Subclavian vein   478                           Near Origin of Confluence +---------------+----------+-------------+----------+-------------------------+ Axillary vein      17                                                     +---------------+----------+-------------+----------+-------------------------+ Confluence         97                                                     +---------------+----------+-------------+----------+-------------------------+ Shoulder          139                                                     +---------------+----------+-------------+----------+-------------------------+ Prox UA           190                                                     +---------------+----------+-------------+----------+-------------------------+ Mid UA            121                                                     +---------------+----------+-------------+----------+-------------------------+ Dist UA           270                                                     +---------------+----------+-------------+----------+-------------------------+  +----------------+-------------+---------+---------+----------+----------------+  Diameter (cm)  Depth  BranchingPSV (cm/s)  Flow Volume                                   (cm)                          (ml/min)     +----------------+-------------+---------+---------+----------+----------------+ Radial Art @                                       70                      rest                                                                      +----------------+-------------+---------+---------+----------+----------------+ Brachial Art                                      266                     prox                                                                      +----------------+-------------+---------+---------+----------+----------------+ Brachial Art                                      206                     Distal                                                                    +----------------+-------------+---------+---------+----------+----------------+  Summary: The Right Upper Arm AVF appears to be patent throughout.  *See table(s) above for measurements and observations.  Diagnosing physician: Leotis Pain MD Electronically signed by Leotis Pain MD on 10/30/2018 at 2:42:45 PM.    --------------------------------------------------------------------------------   Final     Assessment/Plan History of renal transplant Currently working reasonably well. His not using her fistula, but she was high risk with her transplant and we need to keep the AV fistula available for backup if she needs it in the future.  Dyslipidemia lipid control important in reducing the progression of atherosclerotic disease. Continue statin therapy  Complication of vascular access for dialysis Duplex today shows some mildly elevated velocities at the subclavian vein cephalic vein confluence that are borderline hemodynamically significant.  No role for intervention.  Recheck in 1 year or sooner if problems develop in the interim.    Leotis Pain, MD  10/30/2018 3:10 PM    This note was created with Dragon medical transcription system.  Any errors from dictation are purely unintentional

## 2018-10-30 NOTE — Patient Instructions (Signed)
Vascular Access for Hemodialysis        A vascular access is a connection to the blood inside your blood vessels that allows blood to be easily removed from your body and returned to your body during kidney dialysis (hemodialysis). Hemodialysis is a procedure in which a machine outside of the body filters the blood of a person whose kidneys are no longer working properly. There are three types of vascular accesses:  Arteriovenous fistula (AVF). This is a connection between an artery and a vein (usually in the arm) that is made by sewing them together. Blood in the artery flows directly into the vein, causing it to get larger over time. This makes it easier for the vein to be used for hemodialysis. An arteriovenous fistula takes 1-6 months to develop after surgery.  Arteriovenous graft (AVG). This is a connection between an artery and a vein in the arm that is made with a tube. An arteriovenous graft can be used within 2-3 weeks of surgery.  Venous catheter. This is a thin, flexible tube that is placed in a large vein (usually in the neck, chest, or groin). A venous catheter for hemodialysis contains two tubes that come out of the skin. A venous catheter can be used right away. It is usually used as a temporary access if you need hemodialysis before a fistula or graft has developed, or if kidney failure is sudden (acute) and likely to improve without the need for long-term dialysis. It may also be used as a permanent access if a fistula or graft cannot be created. Which type of access is best for me? The type of access that is best for you depends on the size and strength of your veins, your age, and any other health problems that you have, such as diabetes. An ultrasound test may be used to look at your veins to help make this decision. A fistula is usually the preferred type of access. It can last several years and is less likely than the other types of accesses to become infected or to cause a blood  clot within a blood vessel (thrombosis). However, a fistula is not an option for everyone. If your veins are not the right size or if the fistula does not develop properly, a graft may be used instead. Grafts require you to have strong veins. If your veins are not strong enough for a graft, a catheter may be used. Catheters are more likely than fistulas and grafts to become infected or to have a thrombosis. Sometimes, only one type of access is an option. Your health care provider will help you determine which type of access is best for you. How is a vascular access used? The way that the access is used depends on the type of access:  If the access is a fistula or graft, two needles are inserted through the skin into the access before each hemodialysis session. Blood leaves the body through one of the needles and travels through a tube to the hemodialysis machine (dialyzer). Then it flows through another tube and returns to the body through the second needle.  If the access is a catheter, one tube is connected directly to the tube that leads to the dialyzer, and the other tube is connected to a tube that leads away from the dialyzer. Blood leaves the body through one tube and returns to the body through the other. What problems can occur with vascular access?  A blood clot within a blood vessel (thrombosis).  Thrombosis can lead to a narrowing of a blood vessel (stenosis). If thrombosis occurs frequently, another access site may be created as a backup.  Infection.  Heart enlargement (cardiomegaly) and heart failure. Changes in blood flow may cause an increase in blood pressure or heart rate, making your heart work harder to pump blood. These problems are most likely to occur with a venous catheter and least likely to occur with an arteriovenous fistula. How do I care for my vascular access?  Wear a medical alert bracelet. In case of an emergency, this bracelet tells health care providers that you  are a dialysis patient and allows them to care for your veins appropriately. If you have a graft or fistula:  A "bruit" is a noise that is heard with a stethoscope, and a "thrill" is a vibration that is felt over the graft or fistula. The presence of the bruit and thrill indicates that the access is working. You will be taught to feel for the thrill each day. If this is not felt, the access may be clotted. Call your health care provider.  Keep your arm straight and raised (elevated) above your heart while the access site is healing.  You may freely use the arm where your vascular access is located after the site heals. Keep the following in mind: ? Avoid pressure on the arm. ? Avoid lifting heavy objects with the arm. ? Avoid sleeping on the arm. ? Avoid wearing tight-sleeved shirts or jewelry around the graft or fistula.  Do not allow blood pressure monitoring or needle punctures on the side where the graft or fistula is located.  With permission from your health care provider, you may do exercises to help with blood flow through a fistula. These exercises involve squeezing a rubber ball or other soft objects as instructed.  Wash your access site according to directions from your health care provider. If you have a venous catheter:  Keep the insertion site clean and dry at all times.  If you are told you can shower after the site heals, use a protective covering over the catheter to keep it dry.  Follow directions from your health care provider for bandage (dressing) changes.  Ask your health care provider what activities are safe for you. You may be restricted from lifting or making repetitive arm movements on the side with the catheter. Contact a health care provider if:  Swelling around the graft or fistula gets worse.  You develop new pain.  Your catheter gets damaged. Get help right away if:  You have pain, numbness, an unusual pale skin color, or blue fingers or sores at  the tips of your fingers in the hand on the side of your fistula.  You have chills.  You have a fever.  You have pus or other fluid (drainage) at the vascular access site.  You develop skin redness or red streaking on the skin around, above, or below the vascular access.  You have bleeding at the vascular access that cannot be easily controlled.  Your catheter gets pulled out of place.  You feel your heart racing or skipping beats.  You have chest pain. Summary  A vascular access is a connection to the blood inside your blood vessels that allows blood to be easily removed from your body and returned to your body during kidney dialysis (hemodialysis).  There are three types of vascular accesses.The type of access that is best for you depends on the size and strength of your  veins, your age, and any other health problems that you have, such as diabetes. °· A fistula is usually the preferred type of access, although it is not an option for everyone. It can last several years and is less likely than the other types of accesses to become infected or to cause a blood clot within a blood vessel (thrombosis). °· Wear a medical alert bracelet. In case of an emergency, this tells health care providers that you are a dialysis patient. °This information is not intended to replace advice given to you by your health care provider. Make sure you discuss any questions you have with your health care provider. °Document Released: 11/12/2002 Document Revised: 09/16/2016 Document Reviewed: 09/16/2016 °Elsevier Interactive Patient Education © 2019 [employer] ° °

## 2018-11-09 DIAGNOSIS — E663 Overweight: Secondary | ICD-10-CM | POA: Diagnosis not present

## 2018-11-09 DIAGNOSIS — Z1389 Encounter for screening for other disorder: Secondary | ICD-10-CM | POA: Diagnosis not present

## 2018-11-09 DIAGNOSIS — I10 Essential (primary) hypertension: Secondary | ICD-10-CM | POA: Diagnosis not present

## 2018-11-09 DIAGNOSIS — I700 Atherosclerosis of aorta: Secondary | ICD-10-CM | POA: Diagnosis not present

## 2018-11-09 DIAGNOSIS — Z0000 Encounter for general adult medical examination without abnormal findings: Secondary | ICD-10-CM | POA: Diagnosis not present

## 2018-11-09 DIAGNOSIS — E7800 Pure hypercholesterolemia, unspecified: Secondary | ICD-10-CM | POA: Diagnosis not present

## 2018-11-09 DIAGNOSIS — Z940 Kidney transplant status: Secondary | ICD-10-CM | POA: Diagnosis not present

## 2018-11-09 DIAGNOSIS — Q613 Polycystic kidney, unspecified: Secondary | ICD-10-CM | POA: Diagnosis not present

## 2018-11-09 DIAGNOSIS — E2839 Other primary ovarian failure: Secondary | ICD-10-CM | POA: Diagnosis not present

## 2018-11-22 DIAGNOSIS — R110 Nausea: Secondary | ICD-10-CM | POA: Diagnosis not present

## 2018-11-22 DIAGNOSIS — Z940 Kidney transplant status: Secondary | ICD-10-CM | POA: Diagnosis not present

## 2018-11-26 MED FILL — PROGRAF 0.5 MG CAPSULE: 0.5 | 30 days supply | Qty: 30 | Fill #0

## 2018-11-26 MED FILL — MYFORTIC 180 MG TABLET: 180 | 30 days supply | Qty: 120 | Fill #0

## 2018-11-26 MED FILL — NIFEDIPINE ER 60 MG TABLET: 60 | 30 days supply | Qty: 30 | Fill #0

## 2018-11-26 MED FILL — predniSONE 5 MG TABS: 5 | 30 days supply | Qty: 30 | Fill #0

## 2018-11-26 MED FILL — PROGRAF 1 MG CAPSULE: 1 | 30 days supply | Qty: 60 | Fill #0

## 2018-11-27 MED FILL — SULFAMETHOXAZOLE-TMP SS TAB: 400-80 | 84 days supply | Qty: 36 | Fill #0

## 2018-11-27 MED FILL — OMEPRAZOLE 20 MG CPDR: 20 | 90 days supply | Qty: 90 | Fill #0

## 2018-12-26 DIAGNOSIS — Z940 Kidney transplant status: Secondary | ICD-10-CM | POA: Diagnosis not present

## 2018-12-26 DIAGNOSIS — R110 Nausea: Secondary | ICD-10-CM | POA: Diagnosis not present

## 2019-01-07 MED FILL — clonazePAM 1 MG TABS: 1 | 30 days supply | Qty: 60 | Fill #0

## 2019-01-07 MED FILL — PROGRAF 0.5 MG CAPSULE: 0.5 | 30 days supply | Qty: 30 | Fill #1 | Status: TO

## 2019-01-07 MED FILL — predniSONE 5 MG TABS: 5 | 30 days supply | Qty: 30 | Fill #1 | Status: TO

## 2019-01-07 MED FILL — NIFEDIPINE ER OSMOTIC RELEA: 60 | 30 days supply | Qty: 30 | Fill #1 | Status: TO

## 2019-01-07 MED FILL — MYFORTIC 180 MG TABLET: 180 | 30 days supply | Qty: 120 | Fill #0

## 2019-01-07 MED FILL — PROGRAF 1 MG CAPSULE: 1 | 30 days supply | Qty: 60 | Fill #1

## 2019-01-31 DIAGNOSIS — Z940 Kidney transplant status: Secondary | ICD-10-CM | POA: Diagnosis not present

## 2019-01-31 DIAGNOSIS — R110 Nausea: Secondary | ICD-10-CM | POA: Diagnosis not present

## 2019-01-31 DIAGNOSIS — N2581 Secondary hyperparathyroidism of renal origin: Secondary | ICD-10-CM | POA: Diagnosis not present

## 2019-01-31 DIAGNOSIS — R7309 Other abnormal glucose: Secondary | ICD-10-CM | POA: Diagnosis not present

## 2019-02-05 DIAGNOSIS — Z940 Kidney transplant status: Secondary | ICD-10-CM | POA: Diagnosis not present

## 2019-02-05 DIAGNOSIS — I10 Essential (primary) hypertension: Secondary | ICD-10-CM | POA: Diagnosis not present

## 2019-02-05 DIAGNOSIS — Q612 Polycystic kidney, adult type: Secondary | ICD-10-CM | POA: Diagnosis not present

## 2019-02-05 DIAGNOSIS — Z6827 Body mass index (BMI) 27.0-27.9, adult: Secondary | ICD-10-CM | POA: Diagnosis not present

## 2019-02-05 DIAGNOSIS — E785 Hyperlipidemia, unspecified: Secondary | ICD-10-CM | POA: Diagnosis not present

## 2019-02-18 DIAGNOSIS — L820 Inflamed seborrheic keratosis: Secondary | ICD-10-CM | POA: Diagnosis not present

## 2019-02-18 DIAGNOSIS — L821 Other seborrheic keratosis: Secondary | ICD-10-CM | POA: Diagnosis not present

## 2019-02-18 DIAGNOSIS — D485 Neoplasm of uncertain behavior of skin: Secondary | ICD-10-CM | POA: Diagnosis not present

## 2019-02-18 DIAGNOSIS — D0462 Carcinoma in situ of skin of left upper limb, including shoulder: Secondary | ICD-10-CM | POA: Diagnosis not present

## 2019-02-18 DIAGNOSIS — D2272 Melanocytic nevi of left lower limb, including hip: Secondary | ICD-10-CM | POA: Diagnosis not present

## 2019-02-18 DIAGNOSIS — D225 Melanocytic nevi of trunk: Secondary | ICD-10-CM | POA: Diagnosis not present

## 2019-02-18 DIAGNOSIS — L300 Nummular dermatitis: Secondary | ICD-10-CM | POA: Diagnosis not present

## 2019-02-18 DIAGNOSIS — Z85828 Personal history of other malignant neoplasm of skin: Secondary | ICD-10-CM | POA: Diagnosis not present

## 2019-02-18 DIAGNOSIS — D492 Neoplasm of unspecified behavior of bone, soft tissue, and skin: Secondary | ICD-10-CM | POA: Diagnosis not present

## 2019-02-18 DIAGNOSIS — C44319 Basal cell carcinoma of skin of other parts of face: Secondary | ICD-10-CM | POA: Diagnosis not present

## 2019-03-06 DIAGNOSIS — Z940 Kidney transplant status: Secondary | ICD-10-CM | POA: Diagnosis not present

## 2019-03-06 DIAGNOSIS — R110 Nausea: Secondary | ICD-10-CM | POA: Diagnosis not present

## 2019-03-11 DIAGNOSIS — D2239 Melanocytic nevi of other parts of face: Secondary | ICD-10-CM | POA: Diagnosis not present

## 2019-03-11 DIAGNOSIS — C44311 Basal cell carcinoma of skin of nose: Secondary | ICD-10-CM | POA: Diagnosis not present

## 2019-03-11 DIAGNOSIS — Z85828 Personal history of other malignant neoplasm of skin: Secondary | ICD-10-CM | POA: Diagnosis not present

## 2019-03-11 DIAGNOSIS — L988 Other specified disorders of the skin and subcutaneous tissue: Secondary | ICD-10-CM | POA: Diagnosis not present

## 2019-03-12 DIAGNOSIS — C44311 Basal cell carcinoma of skin of nose: Secondary | ICD-10-CM | POA: Diagnosis not present

## 2019-03-19 DIAGNOSIS — C44319 Basal cell carcinoma of skin of other parts of face: Secondary | ICD-10-CM | POA: Diagnosis not present

## 2019-03-19 DIAGNOSIS — Z85828 Personal history of other malignant neoplasm of skin: Secondary | ICD-10-CM | POA: Diagnosis not present

## 2019-04-08 DIAGNOSIS — R110 Nausea: Secondary | ICD-10-CM | POA: Diagnosis not present

## 2019-04-08 DIAGNOSIS — Z940 Kidney transplant status: Secondary | ICD-10-CM | POA: Diagnosis not present

## 2019-04-08 DIAGNOSIS — R7309 Other abnormal glucose: Secondary | ICD-10-CM | POA: Diagnosis not present

## 2019-04-12 DIAGNOSIS — Z940 Kidney transplant status: Secondary | ICD-10-CM | POA: Diagnosis not present

## 2019-04-26 DIAGNOSIS — Z7952 Long term (current) use of systemic steroids: Secondary | ICD-10-CM | POA: Diagnosis not present

## 2019-04-26 DIAGNOSIS — Z79899 Other long term (current) drug therapy: Secondary | ICD-10-CM | POA: Diagnosis not present

## 2019-04-26 DIAGNOSIS — Z940 Kidney transplant status: Secondary | ICD-10-CM | POA: Diagnosis not present

## 2019-04-26 DIAGNOSIS — Z4822 Encounter for aftercare following kidney transplant: Secondary | ICD-10-CM | POA: Diagnosis not present

## 2019-04-26 DIAGNOSIS — R809 Proteinuria, unspecified: Secondary | ICD-10-CM | POA: Diagnosis not present

## 2019-04-26 DIAGNOSIS — Z85828 Personal history of other malignant neoplasm of skin: Secondary | ICD-10-CM | POA: Diagnosis not present

## 2019-04-26 DIAGNOSIS — Z792 Long term (current) use of antibiotics: Secondary | ICD-10-CM | POA: Diagnosis not present

## 2019-04-26 DIAGNOSIS — I10 Essential (primary) hypertension: Secondary | ICD-10-CM | POA: Diagnosis not present

## 2019-05-07 DIAGNOSIS — I10 Essential (primary) hypertension: Secondary | ICD-10-CM | POA: Diagnosis not present

## 2019-05-07 DIAGNOSIS — R809 Proteinuria, unspecified: Secondary | ICD-10-CM | POA: Diagnosis not present

## 2019-05-07 DIAGNOSIS — E872 Acidosis: Secondary | ICD-10-CM | POA: Diagnosis not present

## 2019-05-07 DIAGNOSIS — Z940 Kidney transplant status: Secondary | ICD-10-CM | POA: Diagnosis not present

## 2019-05-07 DIAGNOSIS — E785 Hyperlipidemia, unspecified: Secondary | ICD-10-CM | POA: Diagnosis not present

## 2019-05-07 DIAGNOSIS — Q612 Polycystic kidney, adult type: Secondary | ICD-10-CM | POA: Diagnosis not present

## 2019-05-28 DIAGNOSIS — Z23 Encounter for immunization: Secondary | ICD-10-CM | POA: Diagnosis not present

## 2019-05-30 DIAGNOSIS — E872 Acidosis: Secondary | ICD-10-CM | POA: Diagnosis not present

## 2019-05-30 DIAGNOSIS — Z940 Kidney transplant status: Secondary | ICD-10-CM | POA: Diagnosis not present

## 2019-07-18 DIAGNOSIS — E872 Acidosis: Secondary | ICD-10-CM | POA: Diagnosis not present

## 2019-07-18 DIAGNOSIS — Z940 Kidney transplant status: Secondary | ICD-10-CM | POA: Diagnosis not present

## 2019-07-18 DIAGNOSIS — R7309 Other abnormal glucose: Secondary | ICD-10-CM | POA: Diagnosis not present

## 2019-08-21 DIAGNOSIS — R110 Nausea: Secondary | ICD-10-CM | POA: Diagnosis not present

## 2019-08-21 DIAGNOSIS — Z940 Kidney transplant status: Secondary | ICD-10-CM | POA: Diagnosis not present

## 2019-08-26 ENCOUNTER — Ambulatory Visit
Admission: RE | Admit: 2019-08-26 | Discharge: 2019-08-26 | Disposition: A | Discharge status: Home / Self Care | Payer: 59 | Source: Ambulatory Visit | Attending: Internal Medicine | Admitting: Internal Medicine

## 2019-08-26 ENCOUNTER — Other Ambulatory Visit: Payer: Self-pay | Admitting: Internal Medicine

## 2019-08-26 DIAGNOSIS — M25551 Pain in right hip: Secondary | ICD-10-CM | POA: Diagnosis not present

## 2019-09-02 DIAGNOSIS — M5137 Other intervertebral disc degeneration, lumbosacral region: Secondary | ICD-10-CM | POA: Diagnosis not present

## 2019-09-02 DIAGNOSIS — G8929 Other chronic pain: Secondary | ICD-10-CM | POA: Diagnosis not present

## 2019-09-02 DIAGNOSIS — M25551 Pain in right hip: Secondary | ICD-10-CM | POA: Diagnosis not present

## 2019-09-02 DIAGNOSIS — M419 Scoliosis, unspecified: Secondary | ICD-10-CM | POA: Diagnosis not present

## 2019-09-02 DIAGNOSIS — M5441 Lumbago with sciatica, right side: Secondary | ICD-10-CM | POA: Diagnosis not present

## 2019-09-02 DIAGNOSIS — M47816 Spondylosis without myelopathy or radiculopathy, lumbar region: Secondary | ICD-10-CM | POA: Diagnosis not present

## 2019-09-04 ENCOUNTER — Other Ambulatory Visit: Payer: Self-pay | Admitting: Obstetrics & Gynecology

## 2019-09-04 DIAGNOSIS — Z1231 Encounter for screening mammogram for malignant neoplasm of breast: Secondary | ICD-10-CM

## 2019-09-12 ENCOUNTER — Other Ambulatory Visit: Payer: Self-pay

## 2019-09-12 ENCOUNTER — Ambulatory Visit
Admission: RE | Admit: 2019-09-12 | Discharge: 2019-09-12 | Disposition: A | Discharge status: Home / Self Care | Payer: 59 | Source: Ambulatory Visit | Attending: Sports Medicine | Admitting: Sports Medicine

## 2019-09-12 ENCOUNTER — Other Ambulatory Visit: Payer: Self-pay | Admitting: Sports Medicine

## 2019-09-12 DIAGNOSIS — M25551 Pain in right hip: Secondary | ICD-10-CM | POA: Diagnosis not present

## 2019-09-13 DIAGNOSIS — M7061 Trochanteric bursitis, right hip: Secondary | ICD-10-CM | POA: Diagnosis not present

## 2019-09-13 DIAGNOSIS — M5137 Other intervertebral disc degeneration, lumbosacral region: Secondary | ICD-10-CM | POA: Diagnosis not present

## 2019-09-13 DIAGNOSIS — M419 Scoliosis, unspecified: Secondary | ICD-10-CM | POA: Diagnosis not present

## 2019-09-13 DIAGNOSIS — M5441 Lumbago with sciatica, right side: Secondary | ICD-10-CM | POA: Diagnosis not present

## 2019-09-13 DIAGNOSIS — G8929 Other chronic pain: Secondary | ICD-10-CM | POA: Diagnosis not present

## 2019-09-13 DIAGNOSIS — M25551 Pain in right hip: Secondary | ICD-10-CM | POA: Diagnosis not present

## 2019-09-13 DIAGNOSIS — M47816 Spondylosis without myelopathy or radiculopathy, lumbar region: Secondary | ICD-10-CM | POA: Diagnosis not present

## 2019-09-17 ENCOUNTER — Ambulatory Visit: Payer: 59

## 2019-09-23 DIAGNOSIS — Z6834 Body mass index (BMI) 34.0-34.9, adult: Secondary | ICD-10-CM | POA: Diagnosis not present

## 2019-09-23 DIAGNOSIS — Z01419 Encounter for gynecological examination (general) (routine) without abnormal findings: Secondary | ICD-10-CM | POA: Diagnosis not present

## 2019-10-09 DIAGNOSIS — E785 Hyperlipidemia, unspecified: Secondary | ICD-10-CM | POA: Diagnosis not present

## 2019-10-09 DIAGNOSIS — Z940 Kidney transplant status: Secondary | ICD-10-CM | POA: Diagnosis not present

## 2019-10-09 DIAGNOSIS — E872 Acidosis: Secondary | ICD-10-CM | POA: Diagnosis not present

## 2019-10-09 DIAGNOSIS — R7309 Other abnormal glucose: Secondary | ICD-10-CM | POA: Diagnosis not present

## 2019-10-17 ENCOUNTER — Ambulatory Visit
Admission: RE | Admit: 2019-10-17 | Discharge: 2019-10-17 | Disposition: A | Discharge status: Home / Self Care | Payer: 59 | Source: Ambulatory Visit | Attending: Obstetrics & Gynecology | Admitting: Obstetrics & Gynecology

## 2019-10-17 ENCOUNTER — Other Ambulatory Visit: Payer: Self-pay

## 2019-10-17 DIAGNOSIS — Z1231 Encounter for screening mammogram for malignant neoplasm of breast: Secondary | ICD-10-CM | POA: Diagnosis not present

## 2019-10-31 ENCOUNTER — Ambulatory Visit: Payer: 59 | Attending: Internal Medicine

## 2019-10-31 DIAGNOSIS — Z23 Encounter for immunization: Secondary | ICD-10-CM | POA: Insufficient documentation

## 2019-10-31 NOTE — Progress Notes (Signed)
   Covid-19 Vaccination Clinic  Name:  Megan Small    MRN: [id number] DOB: 1951/07/15  10/31/2019  [name] was observed post Covid-19 immunization for 15 minutes without incidence. She was provided with Vaccine Information Sheet and instruction to access the V-Safe system.   [name] was instructed to call 911 with any severe reactions post vaccine: Marland Kitchen Difficulty breathing  . Swelling of your face and throat  . A fast heartbeat  . A bad rash all over your body  . Dizziness and weakness    Immunizations Administered    Name Date Dose VIS Date Route   Pfizer COVID-19 Vaccine 10/31/2019 11:13 AM 0.3 mL 08/16/2019 Intramuscular   Manufacturer: [place]   Lot: [id number]   [place]: [id number]

## 2019-11-01 ENCOUNTER — Other Ambulatory Visit: Payer: Self-pay

## 2019-11-01 ENCOUNTER — Ambulatory Visit (INDEPENDENT_AMBULATORY_CARE_PROVIDER_SITE_OTHER): Payer: 59

## 2019-11-01 ENCOUNTER — Ambulatory Visit (INDEPENDENT_AMBULATORY_CARE_PROVIDER_SITE_OTHER): Payer: 59 | Admitting: Vascular Surgery

## 2019-11-01 ENCOUNTER — Encounter (INDEPENDENT_AMBULATORY_CARE_PROVIDER_SITE_OTHER): Payer: Self-pay | Admitting: Vascular Surgery

## 2019-11-01 VITALS — BP 122/72 | HR 81 | Resp 10 | Ht 64.0 in | Wt 215.0 lb

## 2019-11-01 DIAGNOSIS — Q613 Polycystic kidney, unspecified: Secondary | ICD-10-CM | POA: Insufficient documentation

## 2019-11-01 DIAGNOSIS — D649 Anemia, unspecified: Secondary | ICD-10-CM | POA: Insufficient documentation

## 2019-11-01 DIAGNOSIS — Q612 Polycystic kidney, adult type: Secondary | ICD-10-CM | POA: Insufficient documentation

## 2019-11-01 DIAGNOSIS — T829XXD Unspecified complication of cardiac and vascular prosthetic device, implant and graft, subsequent encounter: Secondary | ICD-10-CM | POA: Diagnosis not present

## 2019-11-01 DIAGNOSIS — Z940 Kidney transplant status: Secondary | ICD-10-CM

## 2019-11-01 DIAGNOSIS — E785 Hyperlipidemia, unspecified: Secondary | ICD-10-CM | POA: Diagnosis not present

## 2019-11-01 DIAGNOSIS — N951 Menopausal and female climacteric states: Secondary | ICD-10-CM | POA: Insufficient documentation

## 2019-11-01 NOTE — Assessment & Plan Note (Signed)
Duplex today shows a moderate appearing elevation of velocities at the cephalic vein subclavian vein confluence which has not changed significantly from her previous studies. This is not being used for dialysis, but remains for backup.  No intervention will be necessary at this point.  We will continue to monitor this with annual duplex going forward.

## 2019-11-01 NOTE — Progress Notes (Signed)
MRN : [id number]  Megan Small is a 69 y.o. (10/19/1950) female who presents with chief complaint of  Chief Complaint  Patient presents with  . Follow-up    U/S Follow up  .  History of Present Illness: Patient returns today in follow up of her right arm AV fistula.  She has not had to use the fistula in several years after getting a renal transplant but has kept the fistula for a backup and is also had to have some infusions over the years.  At this point, the arm has continued to have a good thrill and no issues with the fistula that she can tell.  Duplex today shows a moderate appearing elevation of velocities at the cephalic vein subclavian vein confluence which has not changed significantly from her previous studies.  Current Outpatient Medications  Medication Sig Dispense Refill  . acetaminophen (TYLENOL) 500 MG tablet Take 500 mg by mouth every 6 (six) hours as needed for mild pain.    Marland Kitchen aspirin 81 MG tablet Take 81 mg by mouth daily.    . calcium carbonate (TUMS EX) 750 MG chewable tablet Chew 1 tablet by mouth 2 (two) times daily as needed.     . cetirizine (ZYRTEC) 10 MG chewable tablet Chew 10 mg by mouth daily.    . Cholecalciferol (VITAMIN D-3) 1000 units CAPS Take 1 capsule by mouth daily.     . clonazePAM (KLONOPIN) 1 MG tablet Take 1 mg by mouth 2 (two) times daily as needed for anxiety.    Marland Kitchen estradiol (ESTRACE) 0.5 MG tablet Take 0.5 mg by mouth daily.    . furosemide (LASIX) 80 MG tablet furosemide 80 mg tablet AS NEEDED    . Melatonin 5 MG CAPS Take 1 capsule by mouth daily as needed.    . Multiple Vitamins-Minerals (CENTRUM SILVER PO) Take 1 capsule by mouth daily.    . mycophenolate (MYFORTIC) 180 MG EC tablet Take 180 mg by mouth 4 (four) times daily.    Marland Kitchen NIFEdipine (PROCARDIA XL/ADALAT-CC) 60 MG 24 hr tablet Take 60 mg by mouth daily.    Marland Kitchen omeprazole (PRILOSEC) 20 MG capsule Take 20 mg by mouth daily.    . ondansetron (ZOFRAN) 8 MG tablet ondansetron HCl 8 mg  tablet    . predniSONE (DELTASONE) 5 MG tablet Take 5 mg by mouth daily with breakfast.    . rosuvastatin (CRESTOR) 5 MG tablet Take 5 mg by mouth daily at 6 PM. Every other day.    . sodium bicarbonate 650 MG tablet sodium bicarbonate 650 mg tablet    . Sulfamethoxazole-Trimethoprim (SULFAMETHOXAZOLE-TMP DS PO) Take 1 capsule by mouth 3 (three) times a week.     . tacrolimus (PROGRAF) 0.5 MG capsule Take 0.5 mg by mouth 2 (two) times daily.     . tacrolimus (PROGRAF) 1 MG capsule Take 1 mg by mouth 2 (two) times daily.    . Cholecalciferol (VITAMIN D3) 1.25 MG (50000 UT) CAPS Vitamin D3    . HYDROcodone-acetaminophen (NORCO/VICODIN) 5-325 MG tablet Take 1 tablet by mouth every 6 (six) hours as needed for moderate pain. (Patient not taking: Reported on 11/01/2019) 30 tablet 0  . PARoxetine Mesylate (BRISDELLE) 7.5 MG CAPS [place] 7.5 mg capsule  Take 1 capsule every day by oral route.     No current facility-administered medications for this visit.    Past Medical History:  Diagnosis Date  . Blood dyscrasia   . Chronic kidney disease   .  History of hiatal hernia   . Hyperlipidemia   . Hypertension     Past Surgical History:  Procedure Laterality Date  . ABDOMINAL HYSTERECTOMY    . BREAST EXCISIONAL BIOPSY    . CARDIAC CATHETERIZATION N/A 02/11/2016   Procedure: Left Heart Cath and Coronary Angiography;  Surgeon: Lorretta Harp, MD;  Location: [facility];  Service: Cardiovascular;  Laterality: N/A;  . CYSTOCELE REPAIR    . KIDNEY TRANSPLANT    . pelvic prolapse    . PERIPHERAL VASCULAR CATHETERIZATION N/A 09/09/2015   Procedure: A/V Shuntogram/Fistulagram;  Surgeon: Algernon Huxley, MD;  Location: [facility];  Service: Cardiovascular;  Laterality: N/A;  . PERIPHERAL VASCULAR CATHETERIZATION N/A 09/09/2015   Procedure: A/V Shunt Intervention;  Surgeon: Algernon Huxley, MD;  Location: [facility];  Service: Cardiovascular;  Laterality: N/A;  . RECTOCELE REPAIR    .  SHOULDER SURGERY Left      Social History   Tobacco Use  . Smoking status: Never Smoker  . Smokeless tobacco: Never Used  Substance Use Topics  . Alcohol use: No  . Drug use: No    Family History  Problem Relation Age of Onset  . Pancreatic cancer Mother   . Aneurysm Mother        brain   . Heart disease Father   . Heart attack Father   . Hyperlipidemia Father   . Polycystic kidney disease Father   . Leukemia Father   . Stroke Maternal Grandmother   . Heart attack Paternal Grandmother   . Polycystic kidney disease Paternal Grandfather   . Pancreatic cancer Brother   . Polycystic kidney disease Other   . Breast cancer Maternal Aunt      Allergies  Allergen Reactions  . Lisinopril Swelling  . Tape Rash    Ok with Paper Tape  Plastic tape tears skin     REVIEW OF SYSTEMS (Negative unless checked)  Constitutional: [] Weight loss  [] Fever  [] Chills Cardiac: [] Chest pain   [] Chest pressure   [] Palpitations   [] Shortness of breath when laying flat   [] Shortness of breath at rest   [] Shortness of breath with exertion. Vascular:  [] Pain in legs with walking   [] Pain in legs at rest   [] Pain in legs when laying flat   [] Claudication   [] Pain in feet when walking  [] Pain in feet at rest  [] Pain in feet when laying flat   [] History of DVT   [] Phlebitis   [] Swelling in legs   [] Varicose veins   [] Non-healing ulcers Pulmonary:   [] Uses home oxygen   [] Productive cough   [] Hemoptysis   [] Wheeze  [] COPD   [] Asthma Neurologic:  [] Dizziness  [] Blackouts   [] Seizures   [] History of stroke   [] History of TIA  [] Aphasia   [] Temporary blindness   [] Dysphagia   [] Weakness or numbness in arms   [] Weakness or numbness in legs Musculoskeletal:  [] Arthritis   [] Joint swelling   [] Joint pain   [] Low back pain Hematologic:  [] Easy bruising  [] Easy bleeding   [] Hypercoagulable state   [] Anemic   Gastrointestinal:  [] Blood in stool   [] Vomiting blood  [] Gastroesophageal reflux/heartburn    [] Abdominal pain Genitourinary:  [x] Chronic kidney disease   [] Difficult urination  [] Frequent urination  [] Burning with urination   [] Hematuria Skin:  [] Rashes   [] Ulcers   [] Wounds Psychological:  [] History of anxiety   []  History of major depression.  Physical Examination  BP 122/72  Pulse 81   Resp 10   Ht 5\' 4"  (1.626 m)   Wt 215 lb (97.5 kg)   BMI 36.90 kg/m  Gen:  WD/WN, NAD Head: [place]/AT, No temporalis wasting. Ear/Nose/Throat: Hearing grossly intact, nares w/o erythema or drainage Eyes: Conjunctiva clear. Sclera non-icteric Neck: Supple.  Trachea midline Pulmonary:  Good air movement, no use of accessory muscles.  Cardiac: RRR, no JVD Vascular: Good thrill in right arm AV fistula Vessel Right Left  Radial Palpable Palpable               Musculoskeletal: M/S 5/5 throughout.  No deformity or atrophy.  No significant lower extremity edema. Neurologic: Sensation grossly intact in extremities.  Symmetrical.  Speech is fluent.  Psychiatric: Judgment intact, Mood & affect appropriate for pt's clinical situation. Dermatologic: No rashes or ulcers noted.  No cellulitis or open wounds.       Labs No results found for this or any previous visit (from the past 2160 hour(s)).  Radiology MM 3D SCREEN BREAST BILATERAL  Result Date: 10/18/2019 CLINICAL DATA:  Screening. EXAM: DIGITAL SCREENING BILATERAL MAMMOGRAM WITH TOMO AND CAD COMPARISON:  Previous exam(s). ACR Breast Density Category c: The breast tissue is heterogeneously dense, which may obscure small masses. FINDINGS: There are no findings suspicious for malignancy. Images were processed with CAD. IMPRESSION: No mammographic evidence of malignancy. A result letter of this screening mammogram will be mailed directly to the patient. RECOMMENDATION: Screening mammogram in one year. (Code:SM-B-01Y) BI-RADS CATEGORY  1: Negative. Electronically Signed   By: Fidela Salisbury M.D.   On: 10/18/2019 17:01     Assessment/Plan History of renal transplant Currently working reasonably well. His not using her fistula, but she was high risk with her transplant and we need to keep the AV fistula available for backup if she needs it in the future.  Dyslipidemia lipid control important in reducing the progression of atherosclerotic disease. Continue statin therapy  Complication of vascular access for dialysis Duplex today shows a moderate appearing elevation of velocities at the cephalic vein subclavian vein confluence which has not changed significantly from her previous studies. This is not being used for dialysis, but remains for backup.  No intervention will be necessary at this point.  We will continue to monitor this with annual duplex going forward.    Leotis Pain, MD  11/01/2019 10:09 AM    This note was created with Dragon medical transcription system.  Any errors from dictation are purely unintentional

## 2019-11-06 DIAGNOSIS — Z940 Kidney transplant status: Secondary | ICD-10-CM | POA: Diagnosis not present

## 2019-11-06 DIAGNOSIS — E872 Acidosis: Secondary | ICD-10-CM | POA: Diagnosis not present

## 2019-11-12 DIAGNOSIS — E872 Acidosis: Secondary | ICD-10-CM | POA: Diagnosis not present

## 2019-11-12 DIAGNOSIS — Z940 Kidney transplant status: Secondary | ICD-10-CM | POA: Diagnosis not present

## 2019-11-12 DIAGNOSIS — E785 Hyperlipidemia, unspecified: Secondary | ICD-10-CM | POA: Diagnosis not present

## 2019-11-12 DIAGNOSIS — I10 Essential (primary) hypertension: Secondary | ICD-10-CM | POA: Diagnosis not present

## 2019-11-12 DIAGNOSIS — R809 Proteinuria, unspecified: Secondary | ICD-10-CM | POA: Diagnosis not present

## 2019-11-12 DIAGNOSIS — Q612 Polycystic kidney, adult type: Secondary | ICD-10-CM | POA: Diagnosis not present

## 2019-11-26 ENCOUNTER — Ambulatory Visit: Payer: 59 | Attending: Internal Medicine

## 2019-11-26 DIAGNOSIS — Z23 Encounter for immunization: Secondary | ICD-10-CM

## 2019-11-26 NOTE — Progress Notes (Signed)
   Covid-19 Vaccination Clinic  Name:  Megan Small    MRN: [id number] DOB: 12-16-1950  11/26/2019  [name] was observed post Covid-19 immunization for 15 minutes without incident. She was provided with Vaccine Information Sheet and instruction to access the V-Safe system.   [name] was instructed to call 911 with any severe reactions post vaccine: Marland Kitchen Difficulty breathing  . Swelling of face and throat  . A fast heartbeat  . A bad rash all over body  . Dizziness and weakness   Immunizations Administered    Name Date Dose VIS Date Route   Pfizer COVID-19 Vaccine 11/26/2019 11:03 AM 0.3 mL 08/16/2019 Intramuscular   Manufacturer: [employer], [employer]   Lot: [id number]   [place]: [id number]

## 2019-12-04 DIAGNOSIS — I700 Atherosclerosis of aorta: Secondary | ICD-10-CM | POA: Diagnosis not present

## 2019-12-04 DIAGNOSIS — E7800 Pure hypercholesterolemia, unspecified: Secondary | ICD-10-CM | POA: Diagnosis not present

## 2019-12-04 DIAGNOSIS — I10 Essential (primary) hypertension: Secondary | ICD-10-CM | POA: Diagnosis not present

## 2019-12-04 DIAGNOSIS — Z0000 Encounter for general adult medical examination without abnormal findings: Secondary | ICD-10-CM | POA: Diagnosis not present

## 2019-12-04 DIAGNOSIS — Z940 Kidney transplant status: Secondary | ICD-10-CM | POA: Diagnosis not present

## 2019-12-04 DIAGNOSIS — Q613 Polycystic kidney, unspecified: Secondary | ICD-10-CM | POA: Diagnosis not present

## 2019-12-12 DIAGNOSIS — E872 Acidosis: Secondary | ICD-10-CM | POA: Diagnosis not present

## 2019-12-12 DIAGNOSIS — Z940 Kidney transplant status: Secondary | ICD-10-CM | POA: Diagnosis not present

## 2020-01-15 DIAGNOSIS — Z940 Kidney transplant status: Secondary | ICD-10-CM | POA: Diagnosis not present

## 2020-01-15 DIAGNOSIS — R7309 Other abnormal glucose: Secondary | ICD-10-CM | POA: Diagnosis not present

## 2020-01-15 DIAGNOSIS — N189 Chronic kidney disease, unspecified: Secondary | ICD-10-CM | POA: Diagnosis not present

## 2020-01-20 DIAGNOSIS — H6120 Impacted cerumen, unspecified ear: Secondary | ICD-10-CM | POA: Diagnosis not present

## 2020-01-20 DIAGNOSIS — H9209 Otalgia, unspecified ear: Secondary | ICD-10-CM | POA: Diagnosis not present

## 2020-01-23 DIAGNOSIS — H6122 Impacted cerumen, left ear: Secondary | ICD-10-CM | POA: Diagnosis not present

## 2020-02-05 DIAGNOSIS — D485 Neoplasm of uncertain behavior of skin: Secondary | ICD-10-CM | POA: Diagnosis not present

## 2020-02-05 DIAGNOSIS — D2271 Melanocytic nevi of right lower limb, including hip: Secondary | ICD-10-CM | POA: Diagnosis not present

## 2020-02-05 DIAGNOSIS — L918 Other hypertrophic disorders of the skin: Secondary | ICD-10-CM | POA: Diagnosis not present

## 2020-02-05 DIAGNOSIS — C44622 Squamous cell carcinoma of skin of right upper limb, including shoulder: Secondary | ICD-10-CM | POA: Diagnosis not present

## 2020-02-05 DIAGNOSIS — D0461 Carcinoma in situ of skin of right upper limb, including shoulder: Secondary | ICD-10-CM | POA: Diagnosis not present

## 2020-02-05 DIAGNOSIS — D692 Other nonthrombocytopenic purpura: Secondary | ICD-10-CM | POA: Diagnosis not present

## 2020-02-05 DIAGNOSIS — L821 Other seborrheic keratosis: Secondary | ICD-10-CM | POA: Diagnosis not present

## 2020-02-05 DIAGNOSIS — Z85828 Personal history of other malignant neoplasm of skin: Secondary | ICD-10-CM | POA: Diagnosis not present

## 2020-02-05 DIAGNOSIS — L570 Actinic keratosis: Secondary | ICD-10-CM | POA: Diagnosis not present

## 2020-02-05 DIAGNOSIS — D225 Melanocytic nevi of trunk: Secondary | ICD-10-CM | POA: Diagnosis not present

## 2020-02-20 DIAGNOSIS — E872 Acidosis: Secondary | ICD-10-CM | POA: Diagnosis not present

## 2020-02-20 DIAGNOSIS — N2581 Secondary hyperparathyroidism of renal origin: Secondary | ICD-10-CM | POA: Diagnosis not present

## 2020-02-20 DIAGNOSIS — Z940 Kidney transplant status: Secondary | ICD-10-CM | POA: Diagnosis not present

## 2020-03-10 DIAGNOSIS — Q612 Polycystic kidney, adult type: Secondary | ICD-10-CM | POA: Diagnosis not present

## 2020-03-10 DIAGNOSIS — Z940 Kidney transplant status: Secondary | ICD-10-CM | POA: Diagnosis not present

## 2020-03-10 DIAGNOSIS — I10 Essential (primary) hypertension: Secondary | ICD-10-CM | POA: Diagnosis not present

## 2020-03-10 DIAGNOSIS — G250 Essential tremor: Secondary | ICD-10-CM | POA: Diagnosis not present

## 2020-03-10 DIAGNOSIS — E785 Hyperlipidemia, unspecified: Secondary | ICD-10-CM | POA: Diagnosis not present

## 2020-03-10 DIAGNOSIS — R809 Proteinuria, unspecified: Secondary | ICD-10-CM | POA: Diagnosis not present

## 2020-04-02 DIAGNOSIS — Z940 Kidney transplant status: Secondary | ICD-10-CM | POA: Diagnosis not present

## 2020-05-19 DIAGNOSIS — N189 Chronic kidney disease, unspecified: Secondary | ICD-10-CM | POA: Diagnosis not present

## 2020-05-19 DIAGNOSIS — Z940 Kidney transplant status: Secondary | ICD-10-CM | POA: Diagnosis not present

## 2020-06-23 DIAGNOSIS — N189 Chronic kidney disease, unspecified: Secondary | ICD-10-CM | POA: Diagnosis not present

## 2020-08-25 DIAGNOSIS — Z940 Kidney transplant status: Secondary | ICD-10-CM | POA: Diagnosis not present

## 2020-08-25 DIAGNOSIS — Z4822 Encounter for aftercare following kidney transplant: Secondary | ICD-10-CM | POA: Diagnosis not present

## 2020-09-15 ENCOUNTER — Other Ambulatory Visit: Payer: Self-pay | Admitting: Obstetrics & Gynecology

## 2020-09-15 DIAGNOSIS — Z1231 Encounter for screening mammogram for malignant neoplasm of breast: Secondary | ICD-10-CM

## 2020-09-16 DIAGNOSIS — H9313 Tinnitus, bilateral: Secondary | ICD-10-CM | POA: Diagnosis not present

## 2020-09-29 DIAGNOSIS — Z940 Kidney transplant status: Secondary | ICD-10-CM | POA: Diagnosis not present

## 2020-09-29 DIAGNOSIS — R7309 Other abnormal glucose: Secondary | ICD-10-CM | POA: Diagnosis not present

## 2020-10-05 DIAGNOSIS — Z01419 Encounter for gynecological examination (general) (routine) without abnormal findings: Secondary | ICD-10-CM | POA: Diagnosis not present

## 2020-10-05 DIAGNOSIS — Z6833 Body mass index (BMI) 33.0-33.9, adult: Secondary | ICD-10-CM | POA: Diagnosis not present

## 2020-10-28 ENCOUNTER — Ambulatory Visit: Payer: 59

## 2020-10-30 ENCOUNTER — Ambulatory Visit (INDEPENDENT_AMBULATORY_CARE_PROVIDER_SITE_OTHER): Payer: 59 | Admitting: Vascular Surgery

## 2020-10-30 ENCOUNTER — Encounter (INDEPENDENT_AMBULATORY_CARE_PROVIDER_SITE_OTHER): Payer: 59

## 2020-10-31 ENCOUNTER — Ambulatory Visit
Admission: RE | Admit: 2020-10-31 | Discharge: 2020-10-31 | Disposition: A | Discharge status: Home / Self Care | Payer: Medicare Other | Source: Ambulatory Visit | Attending: Obstetrics & Gynecology | Admitting: Obstetrics & Gynecology

## 2020-10-31 ENCOUNTER — Other Ambulatory Visit: Payer: Self-pay

## 2020-10-31 DIAGNOSIS — Z1231 Encounter for screening mammogram for malignant neoplasm of breast: Secondary | ICD-10-CM

## 2020-11-03 DIAGNOSIS — Z940 Kidney transplant status: Secondary | ICD-10-CM | POA: Diagnosis not present

## 2020-11-03 DIAGNOSIS — H9190 Unspecified hearing loss, unspecified ear: Secondary | ICD-10-CM | POA: Diagnosis not present

## 2020-11-03 DIAGNOSIS — H9313 Tinnitus, bilateral: Secondary | ICD-10-CM | POA: Diagnosis not present

## 2020-11-10 DIAGNOSIS — Q612 Polycystic kidney, adult type: Secondary | ICD-10-CM | POA: Diagnosis not present

## 2020-11-10 DIAGNOSIS — R809 Proteinuria, unspecified: Secondary | ICD-10-CM | POA: Diagnosis not present

## 2020-11-10 DIAGNOSIS — I10 Essential (primary) hypertension: Secondary | ICD-10-CM | POA: Diagnosis not present

## 2020-11-10 DIAGNOSIS — G250 Essential tremor: Secondary | ICD-10-CM | POA: Diagnosis not present

## 2020-11-10 DIAGNOSIS — E785 Hyperlipidemia, unspecified: Secondary | ICD-10-CM | POA: Diagnosis not present

## 2020-11-10 DIAGNOSIS — Z940 Kidney transplant status: Secondary | ICD-10-CM | POA: Diagnosis not present

## 2020-11-10 DIAGNOSIS — E872 Acidosis: Secondary | ICD-10-CM | POA: Diagnosis not present

## 2020-11-11 DIAGNOSIS — Z940 Kidney transplant status: Secondary | ICD-10-CM | POA: Diagnosis not present

## 2020-11-13 ENCOUNTER — Telehealth: Payer: Self-pay | Admitting: Internal Medicine

## 2020-11-13 NOTE — Telephone Encounter (Signed)
Pt states she is moving to [place]. We received faxed office notes from [facility] on 11/11/20. Pt was a previous [facility] patient not seen since 2017.Pt states she is moving to [place]. Therefore no appointment needed.

## 2020-11-20 ENCOUNTER — Other Ambulatory Visit: Payer: Self-pay | Admitting: Internal Medicine

## 2020-11-20 ENCOUNTER — Ambulatory Visit: Payer: Medicare Other | Attending: Internal Medicine

## 2020-11-20 DIAGNOSIS — Z23 Encounter for immunization: Secondary | ICD-10-CM

## 2020-11-20 NOTE — Progress Notes (Signed)
   Covid-19 Vaccination Clinic  Name:  Shyniece Scripter    MRN: [id number] DOB: 07/28/1951  11/20/2020  Ms. Hamre was observed post Covid-19 immunization for 15 minutes without incident. She was provided with Vaccine Information Sheet and instruction to access the V-Safe system.   Ms. [place] was instructed to call 911 with any severe reactions post vaccine: Marland Kitchen Difficulty breathing  . Swelling of face and throat  . A fast heartbeat  . A bad rash all over body  . Dizziness and weakness   Immunizations Administered    Name Date Dose VIS Date Route   PFIZER Comrnaty(Gray TOP) Covid-19 Vaccine 11/20/2020 12:51 PM 0.3 mL 08/13/2020 Intramuscular   Manufacturer: [employer], [employer]   Lot: [id number]   [place]: [phone]

## 2024-02-05 ENCOUNTER — Other Ambulatory Visit (HOSPITAL_COMMUNITY): Payer: Self-pay
